# Patient Record
Sex: Female | Born: 1965 | Race: White | Hispanic: Yes | Marital: Married | Smoking: Never smoker
Health system: Southern US, Community
[De-identification: ages and names within clinical notes are randomized; demographics above are authoritative.]

## PROBLEM LIST (undated history)

## (undated) DIAGNOSIS — E785 Hyperlipidemia, unspecified: Secondary | ICD-10-CM

## (undated) DIAGNOSIS — E039 Hypothyroidism, unspecified: Secondary | ICD-10-CM

## (undated) DIAGNOSIS — J302 Other seasonal allergic rhinitis: Secondary | ICD-10-CM

## (undated) HISTORY — DX: Hypothyroidism, unspecified: E03.9

## (undated) HISTORY — DX: Other seasonal allergic rhinitis: J30.2

## (undated) HISTORY — PX: TUMOR EXCISION: SHX421

## (undated) HISTORY — DX: Hyperlipidemia, unspecified: E78.5

---

## 2001-11-20 ENCOUNTER — Other Ambulatory Visit: Admission: RE | Admit: 2001-11-20 | Discharge: 2001-11-20 | Payer: Self-pay | Admitting: Gynecology

## 2001-12-16 ENCOUNTER — Encounter: Payer: Self-pay | Admitting: Gastroenterology

## 2001-12-16 ENCOUNTER — Ambulatory Visit (HOSPITAL_COMMUNITY): Admission: RE | Admit: 2001-12-16 | Discharge: 2001-12-16 | Payer: Self-pay | Admitting: Gastroenterology

## 2009-08-31 ENCOUNTER — Ambulatory Visit (HOSPITAL_COMMUNITY): Admission: RE | Admit: 2009-08-31 | Discharge: 2009-08-31 | Payer: Self-pay | Admitting: Family Medicine

## 2010-09-04 ENCOUNTER — Other Ambulatory Visit: Payer: Self-pay

## 2010-09-04 DIAGNOSIS — Z139 Encounter for screening, unspecified: Secondary | ICD-10-CM

## 2010-09-13 ENCOUNTER — Ambulatory Visit (HOSPITAL_COMMUNITY)
Admission: RE | Admit: 2010-09-13 | Discharge: 2010-09-13 | Disposition: A | Discharge status: Home / Self Care | Payer: Self-pay | Source: Ambulatory Visit | Attending: Family Medicine | Admitting: Family Medicine

## 2010-09-13 DIAGNOSIS — Z139 Encounter for screening, unspecified: Secondary | ICD-10-CM

## 2010-09-13 DIAGNOSIS — Z1231 Encounter for screening mammogram for malignant neoplasm of breast: Secondary | ICD-10-CM | POA: Insufficient documentation

## 2011-09-19 ENCOUNTER — Other Ambulatory Visit (HOSPITAL_COMMUNITY): Payer: Self-pay | Admitting: Family Medicine

## 2011-09-19 DIAGNOSIS — Z139 Encounter for screening, unspecified: Secondary | ICD-10-CM

## 2011-09-23 ENCOUNTER — Ambulatory Visit (HOSPITAL_COMMUNITY)
Admission: RE | Admit: 2011-09-23 | Discharge: 2011-09-23 | Disposition: A | Discharge status: Home / Self Care | Payer: Self-pay | Source: Ambulatory Visit | Attending: Family Medicine | Admitting: Family Medicine

## 2011-09-23 DIAGNOSIS — Z139 Encounter for screening, unspecified: Secondary | ICD-10-CM

## 2012-10-06 ENCOUNTER — Other Ambulatory Visit (HOSPITAL_COMMUNITY): Payer: Self-pay | Admitting: *Deleted

## 2012-10-08 ENCOUNTER — Ambulatory Visit (HOSPITAL_COMMUNITY)
Admission: RE | Admit: 2012-10-08 | Discharge: 2012-10-08 | Disposition: A | Discharge status: Home / Self Care | Payer: Self-pay | Source: Ambulatory Visit | Attending: *Deleted | Admitting: *Deleted

## 2013-08-05 ENCOUNTER — Encounter: Payer: Self-pay | Admitting: Family Medicine

## 2013-08-05 ENCOUNTER — Ambulatory Visit (INDEPENDENT_AMBULATORY_CARE_PROVIDER_SITE_OTHER): Payer: BC Managed Care – HMO | Admitting: Family Medicine

## 2013-08-05 VITALS — BP 98/58 | HR 64 | Temp 97.9°F | Wt 201.0 lb

## 2013-08-05 DIAGNOSIS — H109 Unspecified conjunctivitis: Secondary | ICD-10-CM

## 2013-08-05 DIAGNOSIS — G510 Bell's palsy: Secondary | ICD-10-CM

## 2013-08-05 DIAGNOSIS — B009 Herpesviral infection, unspecified: Secondary | ICD-10-CM

## 2013-08-05 DIAGNOSIS — B001 Herpesviral vesicular dermatitis: Secondary | ICD-10-CM

## 2013-08-05 MED ORDER — POLYMYXIN B-TRIMETHOPRIM 10000-0.1 UNIT/ML-% OP SOLN: 2.0000 [drp] | OPHTHALMIC | Status: DC

## 2013-08-05 MED ORDER — FAMCICLOVIR 500 MG PO TABS: 500.0000 mg | ORAL_TABLET | Freq: Two times a day (BID) | ORAL | Status: DC

## 2013-08-05 MED ORDER — MAGIC MOUTHWASH: 5.0000 mL | Freq: Four times a day (QID) | ORAL | Status: DC | PRN

## 2013-08-05 MED ORDER — PREDNISONE 20 MG PO TABS: ORAL_TABLET | ORAL | Status: DC

## 2013-08-05 NOTE — Progress Notes (Signed)
   Subjective:    Patient ID: Kimberly Hicks, female    DOB: 07-08-1966, 48 y.o.   MRN: [id number]  HPI  This 48 y.o. female presents for evaluation of left facial numbness and drooping for a week.  She  Woke up in the am a week ago with left facial weakness, sore or cold sore on her toungue, and Some irritation in her eye.  She has brought her son in with her to help interpret.  Review of Systems No chest pain, SOB, HA, dizziness, vision change, N/V, diarrhea, constipation, dysuria, urinary urgency or frequency, myalgias, arthralgias or rash.     Objective:   Physical Exam  Vital signs noted  Well developed well nourished female.  HEENT - Head atraumatic Normocephalic                Eyes - PERRLA, Conjuctiva - clear Sclera- Clear EOMI                Ears - EAC's Wnl TM's Wnl Gross Hearing WNL                Nose - Nares patent                 Throat - oropharanx wnl Respiratory - Lungs CTA bilateral Cardiac - RRR S1 and S2 without murmur GI - Abdomen soft Nontender and bowel sounds active x 4 Extremities - No edema. Neuro - Left Face is flat w/o any forehead contour or wrinkles.  Unable to scrunch up left  Forehead or eyebrows.        Assessment & Plan:  Bell's palsy - Plan: famciclovir (FAMVIR) 500 MG tablet, predniSONE (DELTASONE) 20 MG tablet, Alum & Mag Hydroxide-Simeth (MAGIC MOUTHWASH) SOLN  Herpes labialis - Plan: famciclovir (FAMVIR) 500 MG tablet, predniSONE (DELTASONE) 20 MG tablet, Alum & Mag Hydroxide-Simeth (MAGIC MOUTHWASH) SOLN  Conjunctivitis - Plan: trimethoprim-polymyxin b (POLYTRIM) ophthalmic solution.  Follow up in one week  Deatra CanterWilliam J Naziyah Tieszen FNP

## 2013-08-05 NOTE — Patient Instructions (Signed)
Bell's Palsy  Bell's palsy is a condition in which the muscles on one side of the face cannot move (paralysis). This is because the nerves in the face are paralyzed. It is most often thought to be caused by a virus. The virus causes swelling of the nerve that controls movement on one side of the face. The nerve travels through a tight space surrounded by bone. When the nerve swells, it can be compressed by the bone. This results in damage to the protective covering around the nerve. This damage interferes with how the nerve communicates with the muscles of the face. As a result, it can cause weakness or paralysis of the facial muscles.   Injury (trauma), tumor, and surgery may cause Bell's palsy, but most of the time the cause is unknown. It is a relatively common condition. It starts suddenly (abrupt onset) with the paralysis usually ending within 2 days. Bell's palsy is not dangerous. But because the eye does not close properly, you may need care to keep the eye from getting dry. This can include splinting (to keep the eye shut) or moistening with artificial tears. Bell's palsy very seldom occurs on both sides of the face at the same time.  SYMPTOMS    Eyebrow sagging.   Drooping of the eyelid and corner of the mouth.   Inability to close one eye.   Loss of taste on the front of the tongue.   Sensitivity to loud noises.  TREATMENT   The treatment is usually non-surgical. If the patient is seen within the first 24 to 48 hours, a short course of steroids may be prescribed, in an attempt to shorten the length of the condition. Antiviral medicines may also be used with the steroids, but it is unclear if they are helpful.   You will need to protect your eye, if you cannot close it. The cornea (clear covering over your eye) will become dry and can be damaged. Artificial tears can be used to keep your eye moist. Glasses or an eye patch should be worn to protect your eye.  PROGNOSIS   Recovery is variable, ranging  from days to months. Although the problem usually goes away completely (about 80% of cases resolve), predicting the outcome is impossible. Most people improve within 3 weeks of when the symptoms began. Improvement may continue for 3 to 6 months. A small number of people have moderate to severe weakness that is permanent.   HOME CARE INSTRUCTIONS    If your caregiver prescribed medication to reduce swelling in the nerve, use as directed. Do not stop taking the medication unless directed by your caregiver.   Use moisturizing eye drops as needed to prevent drying of your eye, as directed by your caregiver.   Protect your eye, as directed by your caregiver.   Use facial massage and exercises, as directed by your caregiver.   Perform your normal activities, and get your normal rest.  SEEK IMMEDIATE MEDICAL CARE IF:    There is pain, redness or irritation in the eye.   You or your child has an oral temperature above 102 F (38.9 C), not controlled by medicine.  MAKE SURE YOU:    Understand these instructions.   Will watch your condition.   Will get help right away if you are not doing well or get worse.  Document Released: 07/15/2005 Document Revised: 10/07/2011 Document Reviewed: 07/24/2009  [employer] Patient Information 2014 [employer].

## 2013-08-12 ENCOUNTER — Encounter: Payer: Self-pay | Admitting: Family Medicine

## 2013-08-12 ENCOUNTER — Ambulatory Visit (INDEPENDENT_AMBULATORY_CARE_PROVIDER_SITE_OTHER): Payer: BC Managed Care – HMO | Admitting: Family Medicine

## 2013-08-12 VITALS — BP 98/59 | HR 68 | Temp 98.2°F | Wt 198.0 lb

## 2013-08-12 DIAGNOSIS — G510 Bell's palsy: Secondary | ICD-10-CM

## 2013-08-12 DIAGNOSIS — H539 Unspecified visual disturbance: Secondary | ICD-10-CM

## 2013-08-12 DIAGNOSIS — R51 Headache: Secondary | ICD-10-CM

## 2013-08-12 DIAGNOSIS — R519 Headache, unspecified: Secondary | ICD-10-CM

## 2013-08-12 MED ORDER — OXCARBAZEPINE 300 MG PO TABS: 300.0000 mg | ORAL_TABLET | Freq: Two times a day (BID) | ORAL | Status: DC

## 2013-08-12 MED ORDER — HYDROCODONE-ACETAMINOPHEN 5-325 MG PO TABS: 1.0000 | ORAL_TABLET | Freq: Four times a day (QID) | ORAL | Status: DC | PRN

## 2013-08-12 NOTE — Progress Notes (Signed)
   Subjective:    Patient ID: Kimberly Hicks, female    DOB: 1965/09/26, 48 y.o.   MRN: [id number]  HPI This 48 y.o. female presents for evaluation of follow up.  She is doing a lot better and she Has left facial discomfort and she c/o some change in her visual acuity.  She is able to move her Left face a lot better and her eye is closing.   Review of Systems No chest pain, SOB, HA, dizziness, vision change, N/V, diarrhea, constipation, dysuria, urinary urgency or frequency, myalgias, arthralgias or rash.     Objective:   Physical Exam Vital signs noted  Well developed well nourished female.  HEENT - Head atraumatic Normocephalic                Eyes - PERRLA, Conjuctiva - clear Sclera- Clear EOMI                Ears - EAC's Wnl TM's Wnl Gross Hearing WNL                Nose - Nares patent                 Throat - oropharanx wnl Respiratory - Lungs CTA bilateral Cardiac - RRR S1 and S2 without murmur GI - Abdomen soft Nontender and bowel sounds active x 4 Extremities - No edema. Neuro - Discomfort along her trigeminal nerve area on the left side of her face       Assessment & Plan:  Bell's palsy - Plan: Oxcarbazepine (TRILEPTAL) 300 MG tablet, DISCONTINUED: Oxcarbazepine (TRILEPTAL) 300 MG tablet, CANCELED: Ambulatory referral to Neurology  Facial pain - Plan: HYDROcodone-acetaminophen (NORCO) 5-325 MG per tablet, Oxcarbazepine (TRILEPTAL) 300 MG tablet, CANCELED: Ambulatory referral to Neurology  Visual disturbance - Plan: Oxcarbazepine (TRILEPTAL) 300 MG tablet, CANCELED: Ambulatory referral to Ophthalmology  She is worried she cannot afford the neurologist so will rx trileptal and follow up in 2 weeks and cx opthamology and neurology referral per patient request  Deatra CanterWilliam J Oxford FNP

## 2013-08-12 NOTE — Patient Instructions (Addendum)
Check with insurance company to see if Kimberly Hicks has a co-pay or deductible for office visits.   Bell's Palsy Bell's palsy is a condition in which the muscles on one side of the face cannot move (paralysis). This is because the nerves in the face are paralyzed. It is most often thought to be caused by a virus. The virus causes swelling of the nerve that controls movement on one side of the face. The nerve travels through a tight space surrounded by bone. When the nerve swells, it can be compressed by the bone. This results in damage to the protective covering around the nerve. This damage interferes with how the nerve communicates with the muscles of the face. As a result, it can cause weakness or paralysis of the facial muscles.  Injury (trauma), tumor, and surgery may cause Bell's palsy, but most of the time the cause is unknown. It is a relatively common condition. It starts suddenly (abrupt onset) with the paralysis usually ending within 2 days. Bell's palsy is not dangerous. But because the eye does not close properly, you may need care to keep the eye from getting dry. This can include splinting (to keep the eye shut) or moistening with artificial tears. Bell's palsy very seldom occurs on both sides of the face at the same time. SYMPTOMS   Eyebrow sagging.  Drooping of the eyelid and corner of the mouth.  Inability to close one eye.  Loss of taste on the front of the tongue.  Sensitivity to loud noises. TREATMENT  The treatment is usually non-surgical. If the patient is seen within the first 24 to 48 hours, a short course of steroids may be prescribed, in an attempt to shorten the length of the condition. Antiviral medicines may also be used with the steroids, but it is unclear if they are helpful.  You will need to protect your eye, if you cannot close it. The cornea (clear covering over your eye) will become dry and can be damaged. Artificial tears can be used to keep your eye moist. Glasses  or an eye patch should be worn to protect your eye. PROGNOSIS  Recovery is variable, ranging from days to months. Although the problem usually goes away completely (about 80% of cases resolve), predicting the outcome is impossible. Most people improve within 3 weeks of when the symptoms began. Improvement may continue for 3 to 6 months. A small number of people have moderate to severe weakness that is permanent.  HOME CARE INSTRUCTIONS   If your caregiver prescribed medication to reduce swelling in the nerve, use as directed. Do not stop taking the medication unless directed by your caregiver.  Use moisturizing eye drops as needed to prevent drying of your eye, as directed by your caregiver.  Protect your eye, as directed by your caregiver.  Use facial massage and exercises, as directed by your caregiver.  Perform your normal activities, and get your normal rest. SEEK IMMEDIATE MEDICAL CARE IF:   There is pain, redness or irritation in the eye.  You or your child has an oral temperature above 102 F (38.9 C), not controlled by medicine. MAKE SURE YOU:   Understand these instructions.  Will watch your condition.  Will get help right away if you are not doing well or get worse. Document Released: 07/15/2005 Document Revised: 10/07/2011 Document Reviewed: 07/24/2009 [facility] Patient Information 2014 [place], [place].

## 2013-08-26 ENCOUNTER — Ambulatory Visit: Payer: BC Managed Care – HMO | Admitting: Family Medicine

## 2013-12-23 ENCOUNTER — Other Ambulatory Visit (HOSPITAL_COMMUNITY): Payer: Self-pay | Admitting: *Deleted

## 2013-12-23 DIAGNOSIS — Z139 Encounter for screening, unspecified: Secondary | ICD-10-CM

## 2013-12-28 ENCOUNTER — Ambulatory Visit (HOSPITAL_COMMUNITY)
Admission: RE | Admit: 2013-12-28 | Discharge: 2013-12-28 | Disposition: A | Discharge status: Home / Self Care | Payer: BC Managed Care – PPO | Source: Ambulatory Visit | Attending: *Deleted | Admitting: *Deleted

## 2013-12-28 DIAGNOSIS — Z1231 Encounter for screening mammogram for malignant neoplasm of breast: Secondary | ICD-10-CM | POA: Insufficient documentation

## 2013-12-28 DIAGNOSIS — Z139 Encounter for screening, unspecified: Secondary | ICD-10-CM

## 2014-09-02 ENCOUNTER — Ambulatory Visit (INDEPENDENT_AMBULATORY_CARE_PROVIDER_SITE_OTHER): Payer: BLUE CROSS/BLUE SHIELD

## 2014-09-02 ENCOUNTER — Ambulatory Visit (INDEPENDENT_AMBULATORY_CARE_PROVIDER_SITE_OTHER): Payer: BLUE CROSS/BLUE SHIELD | Admitting: Family Medicine

## 2014-09-02 VITALS — BP 87/53 | HR 102 | Temp 101.9°F | Ht <= 58 in | Wt 200.0 lb

## 2014-09-02 DIAGNOSIS — R509 Fever, unspecified: Secondary | ICD-10-CM

## 2014-09-02 DIAGNOSIS — K5909 Other constipation: Secondary | ICD-10-CM

## 2014-09-02 DIAGNOSIS — R110 Nausea: Secondary | ICD-10-CM

## 2014-09-02 DIAGNOSIS — N390 Urinary tract infection, site not specified: Secondary | ICD-10-CM

## 2014-09-02 DIAGNOSIS — R1032 Left lower quadrant pain: Secondary | ICD-10-CM

## 2014-09-02 LAB — POCT URINALYSIS DIPSTICK
Bilirubin, UA: NEGATIVE
Glucose, UA: NEGATIVE
Nitrite, UA: NEGATIVE
Spec Grav, UA: 1.02
Urobilinogen, UA: NEGATIVE
pH, UA: 6

## 2014-09-02 LAB — POCT CBC
Granulocyte percent: 89.3 %G — AB (ref 37–80)
HCT, POC: 35.7 % — AB (ref 37.7–47.9)
Hemoglobin: 11 g/dL — AB (ref 12.2–16.2)
Lymph, poc: 1.1 (ref 0.6–3.4)
MCH, POC: 26.8 pg — AB (ref 27–31.2)
MCHC: 30.7 g/dL — AB (ref 31.8–35.4)
MCV: 87.1 fL (ref 80–97)
MPV: 5.9 fL (ref 0–99.8)
POC Granulocyte: 12.9 — AB (ref 2–6.9)
POC LYMPH PERCENT: 7.8 %L — AB (ref 10–50)
Platelet Count, POC: 231 10*3/uL (ref 142–424)
RBC: 4.1 M/uL (ref 4.04–5.48)
RDW, POC: 15.5 %
WBC: 14.4 10*3/uL — AB (ref 4.6–10.2)

## 2014-09-02 LAB — POCT UA - MICROSCOPIC ONLY
Casts, Ur, LPF, POC: NEGATIVE
Crystals, Ur, HPF, POC: NEGATIVE
Yeast, UA: NEGATIVE

## 2014-09-02 LAB — POCT INFLUENZA A/B
Influenza A, POC: NEGATIVE
Influenza B, POC: NEGATIVE

## 2014-09-02 MED ORDER — CEFTRIAXONE SODIUM 1 G IJ SOLR
1.0000 g | Freq: Once | INTRAMUSCULAR | Status: AC
  Administered 2014-09-02: 1 g via INTRAMUSCULAR

## 2014-09-02 MED ORDER — CIPROFLOXACIN HCL 500 MG PO TABS: 500.0000 mg | ORAL_TABLET | Freq: Two times a day (BID) | ORAL | Status: DC

## 2014-09-02 MED ORDER — ONDANSETRON 8 MG PO TBDP: 8.0000 mg | ORAL_TABLET | Freq: Three times a day (TID) | ORAL | Status: DC | PRN

## 2014-09-02 NOTE — Progress Notes (Signed)
Subjective:    Patient ID: Kimberly Hicks, female    DOB: [birth year], 49 y.o.   MRN: [id number]  HPI Patient c/o NVD and fever starting yesterday.  She states she has dysuria and LLQ abdominal pain.  She has been feeling bad since yesterday.  Review of Systems  Constitutional: Negative for fever.  HENT: Negative for ear pain.   Eyes: Negative for discharge.  Respiratory: Negative for cough.   Cardiovascular: Negative for chest pain.  Gastrointestinal: Negative for abdominal distention.  Endocrine: Negative for polyuria.  Genitourinary: Negative for difficulty urinating.  Musculoskeletal: Negative for gait problem and neck pain.  Skin: Negative for color change and rash.  Neurological: Negative for speech difficulty and headaches.  Psychiatric/Behavioral: Negative for agitation.   Results for orders placed or performed in visit on 09/02/14  POCT Influenza A/B  Result Value Ref Range   Influenza A, POC Negative    Influenza B, POC Negative   POCT CBC  Result Value Ref Range   WBC 14.4 (A) 4.6 - 10.2 K/uL   Lymph, poc 1.1 0.6 - 3.4   POC LYMPH PERCENT 7.8 (A) 10 - 50 %L   POC Granulocyte 12.9 (A) 2 - 6.9   Granulocyte percent 89.3 (A) 37 - 80 %G   RBC 4.1 4.04 - 5.48 M/uL   Hemoglobin 11.0 (A) 12.2 - 16.2 g/dL   HCT, POC 40.935.7 (A) 81.137.7 - 47.9 %   MCV 87.1 80 - 97 fL   MCH, POC 26.8 (A) 27 - 31.2 pg   MCHC 30.7 (A) 31.8 - 35.4 g/dL   RDW, POC 91.415.5 %   Platelet Count, POC 231.0 142 - 424 K/uL   MPV 5.9 0 - 99.8 fL  POCT UA - Microscopic Only  Result Value Ref Range   WBC, Ur, HPF, POC 40-80    RBC, urine, microscopic 1-3    Bacteria, U Microscopic moderate    Mucus, UA moderate    Epithelial cells, urine per micros moderate    Crystals, Ur, HPF, POC negative    Casts, Ur, LPF, POC negative    Yeast, UA negaive   POCT urinalysis dipstick  Result Value Ref Range   Color, UA orange    Clarity, UA clear    Glucose, UA negative    Bilirubin, UA negative    Ketones,  UA large    Spec Grav, UA 1.020    Blood, UA large    pH, UA 6.0    Protein, UA 4+    Urobilinogen, UA negative    Nitrite, UA negative    Leukocytes, UA large (3+)       Objective:    BP 87/53 mmHg  Pulse 102  Temp(Src) 101.9 F (38.8 C) (Oral)  Ht 3\' 5"  (1.041 m)  Wt 200 lb (90.719 kg)  BMI 83.71 kg/m2 Physical Exam  Constitutional: She is oriented to person, place, and time. She appears well-developed and well-nourished.  HENT:  Head: Normocephalic and atraumatic.  Mouth/Throat: Oropharynx is clear and moist.  Eyes: Pupils are equal, round, and reactive to light.  Neck: Normal range of motion. Neck supple.  Cardiovascular: Normal rate and regular rhythm.   No murmur heard. Pulmonary/Chest: Effort normal and breath sounds normal.  Abdominal: Bowel sounds are normal. There is tenderness. There is guarding.  Tenderness LLQ and LUQ of abdomen  Neurological: She is alert and oriented to person, place, and time.  Skin: Skin is warm and dry.  Psychiatric: She has a  normal mood and affect.    Results for orders placed or performed in visit on 09/02/14  POCT Influenza A/B  Result Value Ref Range   Influenza A, POC Negative    Influenza B, POC Negative   POCT CBC  Result Value Ref Range   WBC 14.4 (A) 4.6 - 10.2 K/uL   Lymph, poc 1.1 0.6 - 3.4   POC LYMPH PERCENT 7.8 (A) 10 - 50 %L   POC Granulocyte 12.9 (A) 2 - 6.9   Granulocyte percent 89.3 (A) 37 - 80 %G   RBC 4.1 4.04 - 5.48 M/uL   Hemoglobin 11.0 (A) 12.2 - 16.2 g/dL   HCT, POC 16.1 (A) 09.6 - 47.9 %   MCV 87.1 80 - 97 fL   MCH, POC 26.8 (A) 27 - 31.2 pg   MCHC 30.7 (A) 31.8 - 35.4 g/dL   RDW, POC 04.5 %   Platelet Count, POC 231.0 142 - 424 K/uL   MPV 5.9 0 - 99.8 fL  POCT UA - Microscopic Only  Result Value Ref Range   WBC, Ur, HPF, POC 40-80    RBC, urine, microscopic 1-3    Bacteria, U Microscopic moderate    Mucus, UA moderate    Epithelial cells, urine per micros moderate    Crystals, Ur, HPF, POC  negative    Casts, Ur, LPF, POC negative    Yeast, UA negaive   POCT urinalysis dipstick  Result Value Ref Range   Color, UA orange    Clarity, UA clear    Glucose, UA negative    Bilirubin, UA negative    Ketones, UA large    Spec Grav, UA 1.020    Blood, UA large    pH, UA 6.0    Protein, UA 4+    Urobilinogen, UA negative    Nitrite, UA negative    Leukocytes, UA large (3+)    KUB - Constipation Prelimnary reading by Angeline Slim     Assessment & Plan:     ICD-9-CM ICD-10-CM   1. Chills with fever 780.60 R50.9 POCT Influenza A/B     POCT CBC     POCT UA - Microscopic Only     POCT urinalysis dipstick     DG Abd 1 View     ciprofloxacin (CIPRO) 500 MG tablet  2. Left lower quadrant pain 789.04 R10.32 POCT CBC     POCT UA - Microscopic Only     POCT urinalysis dipstick     DG Abd 1 View     ondansetron (ZOFRAN ODT) 8 MG disintegrating tablet  3. Other constipation 564.09 K59.09 ciprofloxacin (CIPRO) 500 MG tablet  4. Urinary tract infection without hematuria, site unspecified 599.0 N39.0 cefTRIAXone (ROCEPHIN) injection 1 g     Urine culture     ciprofloxacin (CIPRO) 500 MG tablet  5. Nausea without vomiting 787.02 R11.0 ondansetron (ZOFRAN ODT) 8 MG disintegrating tablet  6. Fever - Tylenol  po given 7. Nausea - Zofran  po in clinic given  Return if symptoms worsen or fail to improve.  Deatra Canter FNP

## 2014-09-02 NOTE — Progress Notes (Signed)
#   20g started in Right AC per order of Ander SladeBill Oxford NP with 1,000cc of NS that infused in over 1 hour. IV removed per order Pt tolerated well.

## 2014-09-04 LAB — URINE CULTURE

## 2014-09-05 ENCOUNTER — Other Ambulatory Visit: Payer: Self-pay | Admitting: Family Medicine

## 2014-09-05 MED ORDER — AMOXICILLIN-POT CLAVULANATE 875-125 MG PO TABS: 1.0000 | ORAL_TABLET | Freq: Two times a day (BID) | ORAL | Status: DC

## 2014-09-06 ENCOUNTER — Ambulatory Visit (INDEPENDENT_AMBULATORY_CARE_PROVIDER_SITE_OTHER): Payer: BLUE CROSS/BLUE SHIELD | Admitting: Family Medicine

## 2014-09-06 VITALS — BP 95/56 | HR 67 | Temp 97.6°F | Wt 200.0 lb

## 2014-09-06 DIAGNOSIS — J0100 Acute maxillary sinusitis, unspecified: Secondary | ICD-10-CM

## 2014-09-06 MED ORDER — BUTALBITAL-APAP-CAFFEINE 50-325-40 MG PO TABS: 1.0000 | ORAL_TABLET | Freq: Four times a day (QID) | ORAL | Status: AC | PRN

## 2014-09-06 MED ORDER — METHYLPREDNISOLONE (PAK) 4 MG PO TABS: ORAL_TABLET | ORAL | Status: DC

## 2014-09-06 MED ORDER — HYDROXYZINE HCL 25 MG PO TABS
25.0000 mg | ORAL_TABLET | Freq: Three times a day (TID) | ORAL | Status: DC | PRN
Start: 2014-09-06 — End: 2023-11-18

## 2014-09-06 NOTE — Progress Notes (Signed)
   Subjective:    Patient ID: Kimberly Hicks, female    DOB: [birth year], 49 y.o.   MRN: [id number]  HPI Patient is having headache at night.  She is having headache at night and she has facial discomfort. She is having some cough and uri sx's. She is having a rash on her bilateral wists.  Review of Systems  Constitutional: Negative for fever.  HENT: Negative for ear pain.   Eyes: Negative for discharge.  Respiratory: Negative for cough.   Cardiovascular: Negative for chest pain.  Gastrointestinal: Negative for abdominal distention.  Endocrine: Negative for polyuria.  Genitourinary: Negative for difficulty urinating.  Musculoskeletal: Negative for gait problem and neck pain.  Skin: Negative for color change and rash.  Neurological: Negative for speech difficulty and headaches.  Psychiatric/Behavioral: Negative for agitation.       Objective:    BP 95/56 mmHg  Pulse 67  Temp(Src) 97.6 F (36.4 C) (Oral)  Wt 200 lb (90.719 kg) Physical Exam  Constitutional: She is oriented to person, place, and time. She appears well-developed and well-nourished.  HENT:  Head: Normocephalic and atraumatic.  Mouth/Throat: Oropharynx is clear and moist.  Eyes: Pupils are equal, round, and reactive to light.  Neck: Normal range of motion. Neck supple.  Cardiovascular: Normal rate and regular rhythm.   No murmur heard. Pulmonary/Chest: Effort normal and breath sounds normal.  Abdominal: Soft. Bowel sounds are normal. There is no tenderness.  Neurological: She is alert and oriented to person, place, and time.  Skin: Skin is warm and dry.  Psychiatric: She has a normal mood and affect.          Assessment & Plan:     ICD-9-CM ICD-10-CM   1. Subacute maxillary sinusitis 461.0 J01.00 butalbital-acetaminophen-caffeine (FIORICET) 50-325-40 MG per tablet     methylPREDNIsolone (MEDROL DOSPACK) 4 MG tablet     hydrOXYzine (ATARAX/VISTARIL) 25 MG tablet     No Follow-up on file.  Deatra CanterWilliam  J Oxford FNP

## 2015-01-11 ENCOUNTER — Other Ambulatory Visit (HOSPITAL_COMMUNITY): Payer: Self-pay | Admitting: *Deleted

## 2015-01-11 DIAGNOSIS — Z1231 Encounter for screening mammogram for malignant neoplasm of breast: Secondary | ICD-10-CM

## 2015-01-19 ENCOUNTER — Ambulatory Visit (HOSPITAL_COMMUNITY)
Admission: RE | Admit: 2015-01-19 | Discharge: 2015-01-19 | Disposition: A | Discharge status: Home / Self Care | Payer: BLUE CROSS/BLUE SHIELD | Source: Ambulatory Visit | Attending: *Deleted | Admitting: *Deleted

## 2015-01-19 DIAGNOSIS — Z1231 Encounter for screening mammogram for malignant neoplasm of breast: Secondary | ICD-10-CM

## 2016-01-16 ENCOUNTER — Other Ambulatory Visit (HOSPITAL_COMMUNITY): Payer: Self-pay | Admitting: *Deleted

## 2016-01-16 DIAGNOSIS — Z1231 Encounter for screening mammogram for malignant neoplasm of breast: Secondary | ICD-10-CM

## 2016-01-22 ENCOUNTER — Ambulatory Visit (HOSPITAL_COMMUNITY)
Admission: RE | Admit: 2016-01-22 | Discharge: 2016-01-22 | Disposition: A | Discharge status: Home / Self Care | Payer: Self-pay | Source: Ambulatory Visit | Attending: *Deleted | Admitting: *Deleted

## 2016-01-22 DIAGNOSIS — Z1231 Encounter for screening mammogram for malignant neoplasm of breast: Secondary | ICD-10-CM

## 2017-06-03 ENCOUNTER — Other Ambulatory Visit: Payer: Self-pay | Admitting: Family Medicine

## 2017-06-03 ENCOUNTER — Ambulatory Visit
Admission: RE | Admit: 2017-06-03 | Discharge: 2017-06-03 | Disposition: A | Discharge status: Home / Self Care | Payer: No Typology Code available for payment source | Source: Ambulatory Visit | Attending: Family Medicine | Admitting: Family Medicine

## 2017-06-03 DIAGNOSIS — M79672 Pain in left foot: Secondary | ICD-10-CM

## 2018-02-10 ENCOUNTER — Other Ambulatory Visit (HOSPITAL_COMMUNITY): Payer: Self-pay | Admitting: *Deleted

## 2018-02-10 DIAGNOSIS — Z1231 Encounter for screening mammogram for malignant neoplasm of breast: Secondary | ICD-10-CM

## 2018-02-19 ENCOUNTER — Ambulatory Visit (HOSPITAL_COMMUNITY)
Admission: RE | Admit: 2018-02-19 | Discharge: 2018-02-19 | Disposition: A | Discharge status: Home / Self Care | Payer: PRIVATE HEALTH INSURANCE | Source: Ambulatory Visit | Attending: *Deleted | Admitting: *Deleted

## 2018-02-19 DIAGNOSIS — Z1231 Encounter for screening mammogram for malignant neoplasm of breast: Secondary | ICD-10-CM | POA: Diagnosis not present

## 2019-03-16 ENCOUNTER — Other Ambulatory Visit (HOSPITAL_COMMUNITY): Payer: Self-pay | Admitting: *Deleted

## 2019-03-16 DIAGNOSIS — Z1231 Encounter for screening mammogram for malignant neoplasm of breast: Secondary | ICD-10-CM

## 2019-03-25 ENCOUNTER — Ambulatory Visit (HOSPITAL_COMMUNITY)
Admission: RE | Admit: 2019-03-25 | Discharge: 2019-03-25 | Disposition: A | Discharge status: Home / Self Care | Payer: PRIVATE HEALTH INSURANCE | Source: Ambulatory Visit | Attending: *Deleted | Admitting: *Deleted

## 2019-03-25 ENCOUNTER — Other Ambulatory Visit: Payer: Self-pay

## 2019-03-25 DIAGNOSIS — Z1231 Encounter for screening mammogram for malignant neoplasm of breast: Secondary | ICD-10-CM | POA: Insufficient documentation

## 2020-07-20 ENCOUNTER — Emergency Department (HOSPITAL_COMMUNITY): Payer: HRSA Program

## 2020-07-20 ENCOUNTER — Encounter (HOSPITAL_COMMUNITY): Payer: Self-pay | Admitting: Emergency Medicine

## 2020-07-20 ENCOUNTER — Emergency Department (HOSPITAL_COMMUNITY)
Admission: EM | Admit: 2020-07-20 | Discharge: 2020-07-20 | Disposition: A | Discharge status: Home / Self Care | Payer: HRSA Program | Attending: Emergency Medicine | Admitting: Emergency Medicine

## 2020-07-20 ENCOUNTER — Other Ambulatory Visit: Payer: Self-pay

## 2020-07-20 DIAGNOSIS — E039 Hypothyroidism, unspecified: Secondary | ICD-10-CM | POA: Insufficient documentation

## 2020-07-20 DIAGNOSIS — Z20822 Contact with and (suspected) exposure to covid-19: Secondary | ICD-10-CM

## 2020-07-20 DIAGNOSIS — U071 COVID-19: Secondary | ICD-10-CM | POA: Diagnosis not present

## 2020-07-20 DIAGNOSIS — Z79899 Other long term (current) drug therapy: Secondary | ICD-10-CM | POA: Insufficient documentation

## 2020-07-20 DIAGNOSIS — J069 Acute upper respiratory infection, unspecified: Secondary | ICD-10-CM | POA: Diagnosis not present

## 2020-07-20 DIAGNOSIS — R059 Cough, unspecified: Secondary | ICD-10-CM | POA: Diagnosis present

## 2020-07-20 LAB — RESP PANEL BY RT-PCR (FLU A&B, COVID) ARPGX2
Influenza A by PCR: NEGATIVE
Influenza B by PCR: NEGATIVE
SARS Coronavirus 2 by RT PCR: POSITIVE — AB

## 2020-07-20 MED ORDER — ACETAMINOPHEN 325 MG PO TABS
650.0000 mg | ORAL_TABLET | Freq: Once | ORAL | Status: AC | PRN
  Administered 2020-07-20: 650 mg via ORAL
  Filled 2020-07-20: qty 2

## 2020-07-20 MED ORDER — ONDANSETRON 4 MG PO TBDP: 4.0000 mg | ORAL_TABLET | Freq: Three times a day (TID) | ORAL | 0 refills | Status: DC | PRN

## 2020-07-20 MED ORDER — ONDANSETRON 4 MG PO TBDP
4.0000 mg | ORAL_TABLET | Freq: Once | ORAL | Status: AC
  Administered 2020-07-20: 20:00:00 4 mg via ORAL
  Filled 2020-07-20: qty 1

## 2020-07-20 NOTE — ED Provider Notes (Signed)
[facility] EMERGENCY DEPARTMENT Provider Note   CSN: [id number] Arrival date & time: 07/20/20  1813     History Chief Complaint  Patient presents with  . Cough    Kimberly Hicks is a 54 y.o. female.  HPI 54 year old female with a history of hypothyroidism presents to the ER with Covid-like symptoms.  History provided via a [place] interpreter.  Patient states that her son tested positive for Covid last week.  Has had nausea, vomiting, poor p.o. intake, diarrhea, cough, fevers.  She is not vaccinated.  Denies any chest pain, no shortness of breath.  Has had nonbloody vomiting.  Her other son is also exhibiting similar symptoms.    Past Medical History:  Diagnosis Date  . Hypothyroidism     There are no problems to display for this patient.   Past Surgical History:  Procedure Laterality Date  . CESAREAN SECTION    . TUMOR EXCISION       OB History   No obstetric history on file.     Family History  Problem Relation Age of Onset  . Pulmonary embolism Mother   . Alcohol abuse Sister     Social History   Tobacco Use  . Smoking status: Never Smoker  . Smokeless tobacco: Never Used  Vaping Use  . Vaping Use: Never used  Substance Use Topics  . Alcohol use: No  . Drug use: No    Home Medications Prior to Admission medications   Medication Sig Start Date End Date Taking? Authorizing Provider  Alum & Mag Hydroxide-Simeth (MAGIC MOUTHWASH) SOLN Take 5 mLs by mouth 4 (four) times daily as needed for mouth pain. 08/05/13   Deatra Canter, FNP  amoxicillin-clavulanate (AUGMENTIN) 875-125 MG per tablet Take 1 tablet by mouth 2 (two) times daily. 09/05/14   Deatra Canter, FNP  ciprofloxacin (CIPRO) 500 MG tablet Take 1 tablet (500 mg total) by mouth 2 (two) times daily. 09/02/14   Deatra Canter, FNP  famciclovir (FAMVIR) 500 MG tablet Take 1 tablet (500 mg total) by mouth 2 (two) times daily. 08/05/13   Deatra Canter, FNP  HYDROcodone-acetaminophen (NORCO)  5-325 MG per tablet Take 1 tablet by mouth every 6 (six) hours as needed for moderate pain. 08/12/13   Deatra Canter, FNP  hydrOXYzine (ATARAX/VISTARIL) 25 MG tablet Take 1 tablet (25 mg total) by mouth 3 (three) times daily as needed. 09/06/14   Deatra Canter, FNP  ibuprofen (ADVIL,MOTRIN) 800 MG tablet Take 800 mg by mouth 3 (three) times daily.    [provider]  levothyroxine (SYNTHROID, LEVOTHROID) 25 MCG tablet Take 25 mcg by mouth daily before breakfast.    [provider]  methylPREDNIsolone (MEDROL DOSPACK) 4 MG tablet follow package directions 09/06/14   Deatra Canter, FNP  ondansetron (ZOFRAN ODT) 4 MG disintegrating tablet Take 1 tablet (4 mg total) by mouth every 8 (eight) hours as needed for nausea or vomiting. 07/20/20   Mare Ferrari, PA-C  Oxcarbazepine (TRILEPTAL) 300 MG tablet Take 1 tablet (300 mg total) by mouth 2 (two) times daily. 08/12/13   Deatra Canter, FNP    Allergies    Polytrim [polymyxin b-trimethoprim] and Ciprofloxacin  Review of Systems   Review of Systems  Constitutional: Positive for activity change, appetite change, chills, fatigue and fever.  Respiratory: Positive for cough. Negative for shortness of breath.   Cardiovascular: Negative for chest pain.  Gastrointestinal: Positive for diarrhea, nausea and vomiting. Negative for abdominal pain.  Genitourinary: Negative for dysuria.  Neurological: Positive for weakness.    Physical Exam Updated Vital Signs BP 132/79 (BP Location: Right Arm)   Pulse 88   Temp (!) 100.7 F (38.2 C) (Oral)   Resp 17   Ht 5\' 1"  (1.549 m)   Wt 93.9 kg   SpO2 98%   BMI 39.11 kg/m   Physical Exam Vitals and nursing note reviewed.  Constitutional:      General: She is not in acute distress.    Appearance: She is well-developed and well-nourished. She is obese. She is not ill-appearing, toxic-appearing or diaphoretic.     Comments: Well-appearing, speaking full sentences in no acute  respiratory distress  HENT:     Head: Normocephalic and atraumatic.  Eyes:     Conjunctiva/sclera: Conjunctivae normal.  Cardiovascular:     Rate and Rhythm: Normal rate and regular rhythm.     Pulses: Normal pulses.     Heart sounds: No murmur heard.   Pulmonary:     Effort: Pulmonary effort is normal. No respiratory distress.     Breath sounds: Normal breath sounds.  Abdominal:     General: Abdomen is flat.     Palpations: Abdomen is soft.     Tenderness: There is no abdominal tenderness.  Musculoskeletal:        General: No edema.     Cervical back: Neck supple.  Skin:    General: Skin is warm and dry.  Neurological:     Mental Status: She is alert.  Psychiatric:        Mood and Affect: Mood and affect normal.     ED Results / Procedures / Treatments   Labs (all labs ordered are listed, but only abnormal results are displayed) Labs Reviewed  RESP PANEL BY RT-PCR (FLU A&B, COVID) [id number]    EKG None  Radiology DG Chest Portable 1 View  Result Date: 07/20/2020 CLINICAL DATA:  Cough, COVID exposure EXAM: PORTABLE CHEST 1 VIEW COMPARISON:  None. FINDINGS: Streaky basilar opacities and more patchy opacity in the left basilar periphery. No pneumothorax or effusion. No convincing features of edema. The cardiomediastinal contours are unremarkable. No acute osseous or soft tissue abnormality. IMPRESSION: Streaky basilar opacities with more patchy opacity in the left basilar periphery, suspicious for infection including atypical viral pneumonia in the setting of COVID 19 exposure. Electronically Signed   By: 07/22/2020 M.D.   On: 07/20/2020 19:54    Procedures Procedures (including critical care time)  Medications Ordered in ED Medications  acetaminophen (TYLENOL) tablet 650 mg (650 mg Oral Given 07/20/20 1842)  ondansetron (ZOFRAN-ODT) disintegrating tablet 4 mg (4 mg Oral Given 07/20/20 1948)    ED Course  I have reviewed the triage vital signs and the nursing  notes.  Pertinent labs & imaging results that were available during my care of the patient were reviewed by me and considered in my medical decision making (see chart for details).    MDM Rules/Calculators/A&P                      Patients symptoms are consistent with URI, likely viral etiology. Consider COVID-19, COVID test sent. Known COVID exposure. Patient is not vaccinated against COVID-19. Patient is febrile with a temp of 100.7, treated with Tyleno, tolerating secretions.  Lungs clear to auscultation bilaterally. Normal O2 saturation including at rest and ambulation. CXR suspicious for viral pneumonia in the setting of known COVID 19 exposure. Pt will be discharged with symptomatic  treatment, home isolation precautions. Zofran sent for nausea. Will contact [facility] infusion clinic if COVID test is positive. Return precautions discussed. Verbalizes understanding and is agreeable with plan. Pt is hemodynamically stable & in NAD prior to dc.  Final Clinical Impression(s) / ED Diagnoses Final diagnoses:  Encounter for laboratory testing for COVID-19 virus  Upper respiratory tract infection, unspecified type    Rx / DC Orders ED Discharge Orders         Ordered    ondansetron (ZOFRAN ODT) 4 MG disintegrating tablet  Every 8 hours PRN        07/20/20 1958           Leone Brand 07/20/20 Alycia Patten, MD 07/21/20 1517

## 2020-07-20 NOTE — ED Notes (Signed)
Pt ambulated around room multiple times and appeared to be in NAD. O2 remained 95-96% on room air.

## 2020-07-20 NOTE — ED Notes (Signed)
X-ray at bedside

## 2020-07-20 NOTE — Discharge Instructions (Addendum)
Lea las instrucciones a continuacin. Puede alternar Tylenol / acetaminofn y Advil / ibuprofeno / Motrin cada 4 horas para el dolor de garganta, dolores corporales, dolor de [place] o [place]. Puede usar Zofran segn sea necesario para las nuseas [address]. Use un aerosol nasal de solucin salina para la congestin. Puede tomar Robitussin cada 8 horas segn sea necesario para la tos. Lvese las manos con frecuencia. Tienes pendiente una prueba de COVID. Asle en casa mientras espera sus resultados. > Si su prueba es negativa, qudese en casa hasta que la fiebre haya desaparecido o sus sntomas mejoren. > Si su prueba es positiva, asle en casa durante al menos 14 das despus del da en que comenzaron sus sntomas [place], y [place] al menos 24 horas despus de que no tenga fiebre sin la ayuda de medicamentos Y sus sntomas estn mejorando. > Si su prueba es positiva, la clnica de infusin [facility] se Audiological scientist con usted para discutir la programacin de una infusin como posible tratamiento para COVID. Su radiografa de trax [place] que tiene una infeccin de neumona; sin embargo, es probable que se deba a un virus y no se trata con antibiticos. Esto debera aclararse por s solo. Haga un seguimiento con su proveedor de atencin primaria o con la clnica de atencin posterior a COVID en [place]. Puede comprar un oxmetro de pulso que [employer] niveles de oxgeno en su sangre; regrese a la sala de emergencias si sus niveles estn constantemente por debajo del 90% Regrese a la sala de emergencias por falta de aire significativa, vmitos incontrolables, dolor de pecho intenso u otros sntomas preocupantes.      Please read instructions below.  You can alternate Tylenol/acetaminophen and Advil/ibuprofen/Motrin every 4 hours for sore throat, body aches, headache or fever.  You may use Zofran as needed for nausea Drink plenty of water.  Use saline nasal spray for congestion. You can  take Robitussin every 8 hours as needed for cough. Wash your hands frequently. You have a COVID test pending. Please isolate at home while awaiting your results.  > If your test is negative, stay home until your fever has resolved/your symptoms are improving. > If your test is positive, isolate at home for at least 14 days after the day your symptoms initially began, and THEN at least 24 hours after you are fever-free without the help of medications AND your symptoms are improving.  > If your test is positive, the [facility] infusion clinic will contact you to discuss scheduling an infusion as a possible treatment for COVID. Your chest xray does show that you have a pneumonia infection, however this is likely due to a virus and is not treated with antibiotics.  This should clear on its own. Follow up with your primary care provider or the post-COVID care clinic at [facility]. You may buy a pulse oximeter which measures the levels of oxygen in your blood, please return to the ER if your levels are consistently below 90% Return to the ER for significant shortness of breath, uncontrollable vomiting, severe chest pain, or other concerning symptoms.

## 2020-07-20 NOTE — ED Triage Notes (Signed)
Pt c/o of couand fever since Monday. Pt was exposed to covid.

## 2020-07-23 ENCOUNTER — Telehealth: Payer: Self-pay | Admitting: Family

## 2020-07-23 NOTE — Telephone Encounter (Signed)
Called to Discuss with patient about Covid symptoms and the use of the monoclonal antibody infusion for those with mild to moderate Covid symptoms and at a high risk of hospitalization.     Pt appears to qualify for this infusion due to co-morbid conditions and/or a member of an at-risk group in accordance with the FDA Emergency Use Authorization.  Kimberly Hicks's primary language is Spanish and a medical interpreter was used to aid in communication.  Kimberly Hicks was seen at the [facility] ED on 12/23 with COVID like symptoms including nausea, vomiting, diarrhea, cough and fevers. She is unvaccinated. Qualifying risk factors include BMI >25 (39) and high risk social situation.   Interpreter was unable to reach her and mailbox was full. Unable to leave MyChart message as it was not set up.    [name], NP 07/23/2020 9:31 AM

## 2020-08-22 ENCOUNTER — Other Ambulatory Visit (HOSPITAL_COMMUNITY): Payer: Self-pay | Admitting: *Deleted

## 2020-08-22 DIAGNOSIS — Z1231 Encounter for screening mammogram for malignant neoplasm of breast: Secondary | ICD-10-CM

## 2020-09-07 ENCOUNTER — Other Ambulatory Visit: Payer: Self-pay

## 2020-09-07 ENCOUNTER — Ambulatory Visit (HOSPITAL_COMMUNITY)
Admission: RE | Admit: 2020-09-07 | Discharge: 2020-09-07 | Disposition: A | Discharge status: Home / Self Care | Payer: PRIVATE HEALTH INSURANCE | Source: Ambulatory Visit | Attending: *Deleted | Admitting: *Deleted

## 2020-09-07 DIAGNOSIS — Z1231 Encounter for screening mammogram for malignant neoplasm of breast: Secondary | ICD-10-CM | POA: Insufficient documentation

## 2021-11-19 IMAGING — MG MM DIGITAL SCREENING BILAT W/ TOMO AND CAD
8 series · 8 of 24 positions shown · non-contrast
Comparison: Previous exam(s).

CLINICAL DATA: Screening.

EXAM:
DIGITAL SCREENING BILATERAL MAMMOGRAM WITH TOMOSYNTHESIS AND CAD
TECHNIQUE: Bilateral screening digital craniocaudal and mediolateral oblique
mammograms were obtained. Bilateral screening digital breast
tomosynthesis was performed. The images were evaluated with
computer-aided detection.

[R MLO synth-2D]
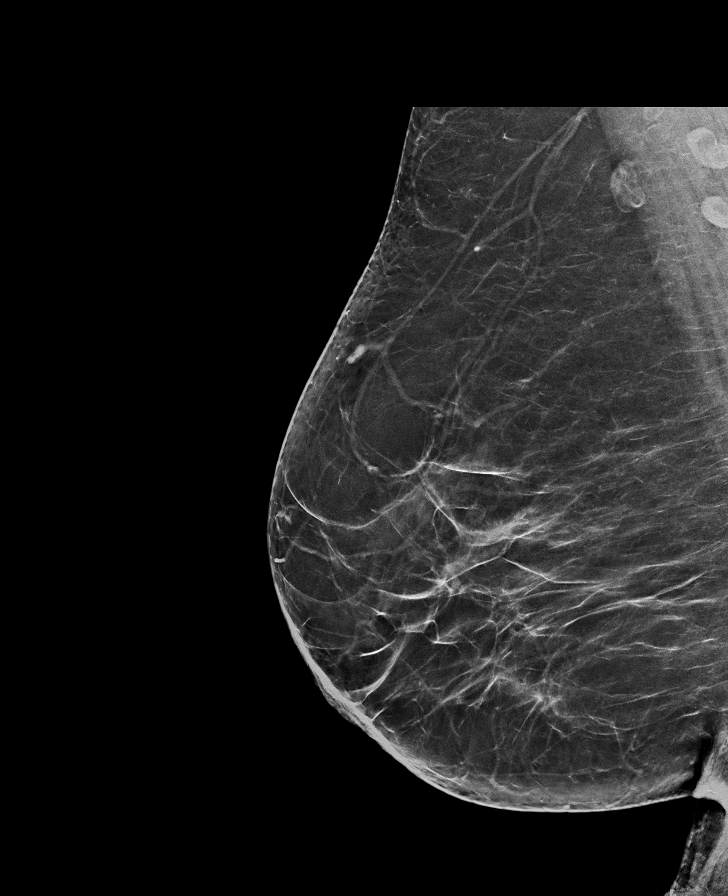

[L MLO synth-2D]
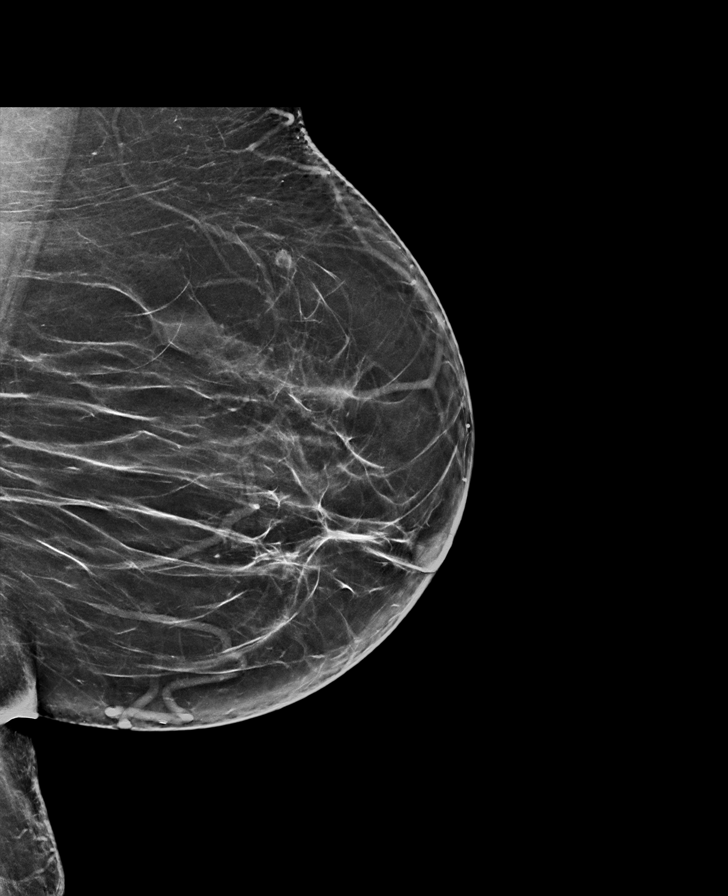

[L CC synth-2D]
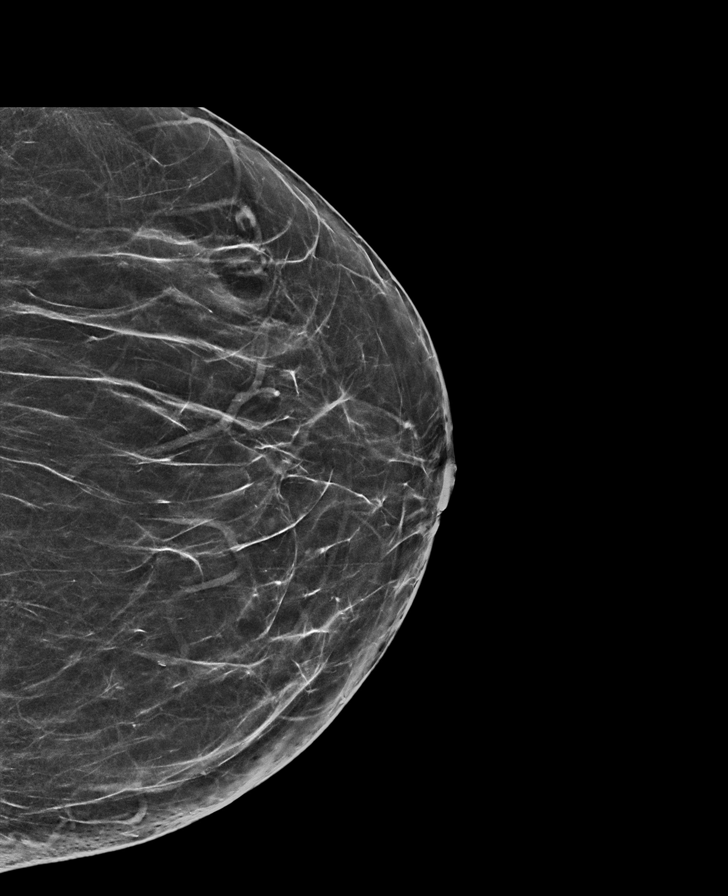

[R CC synth-2D]
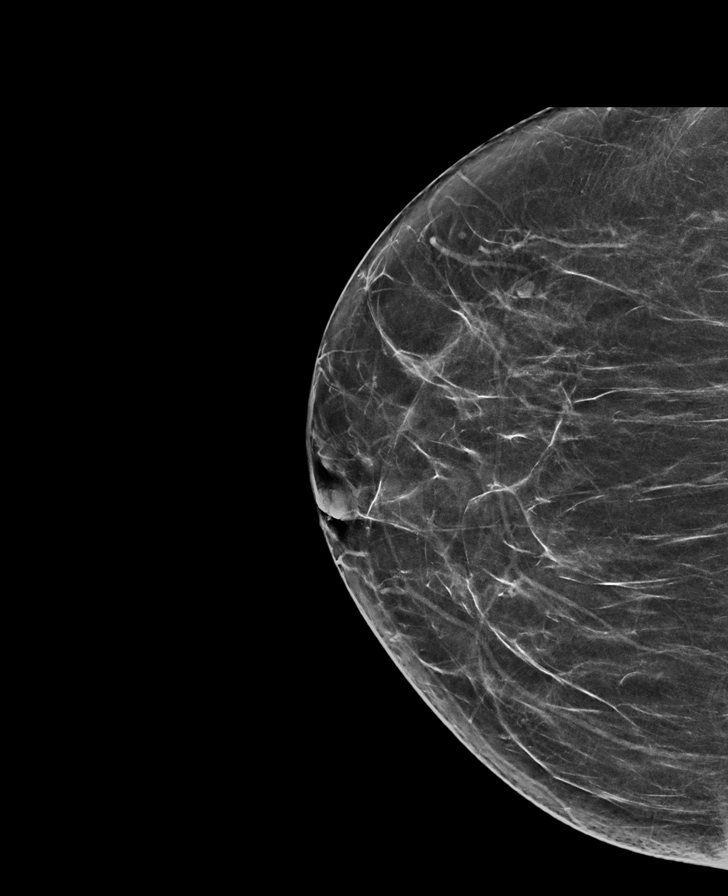

[L MLO tomo · tomo slice 37/74.0]
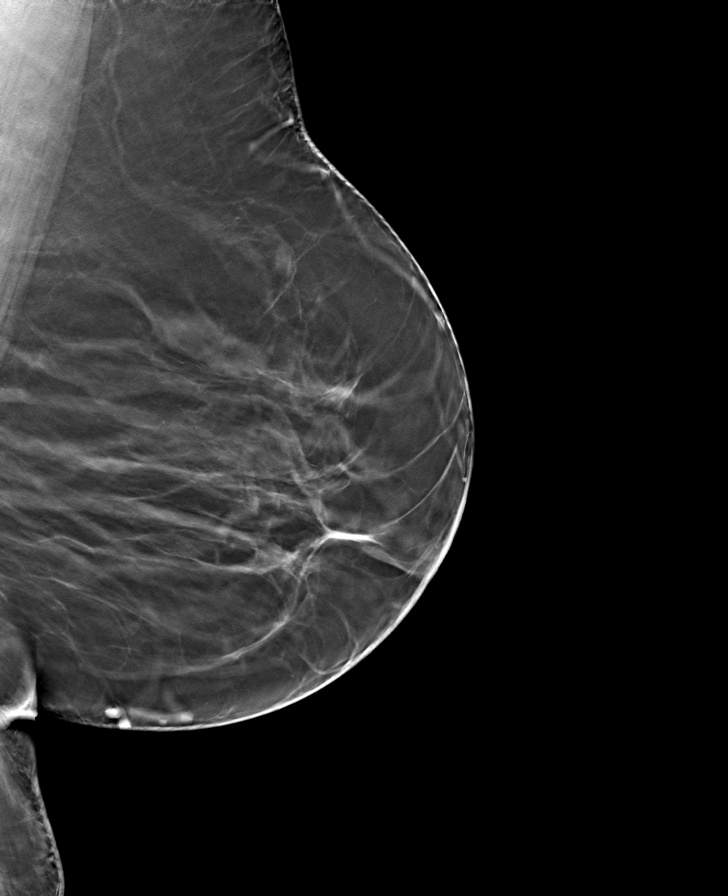

[L CC tomo · tomo slice 33/65.0]
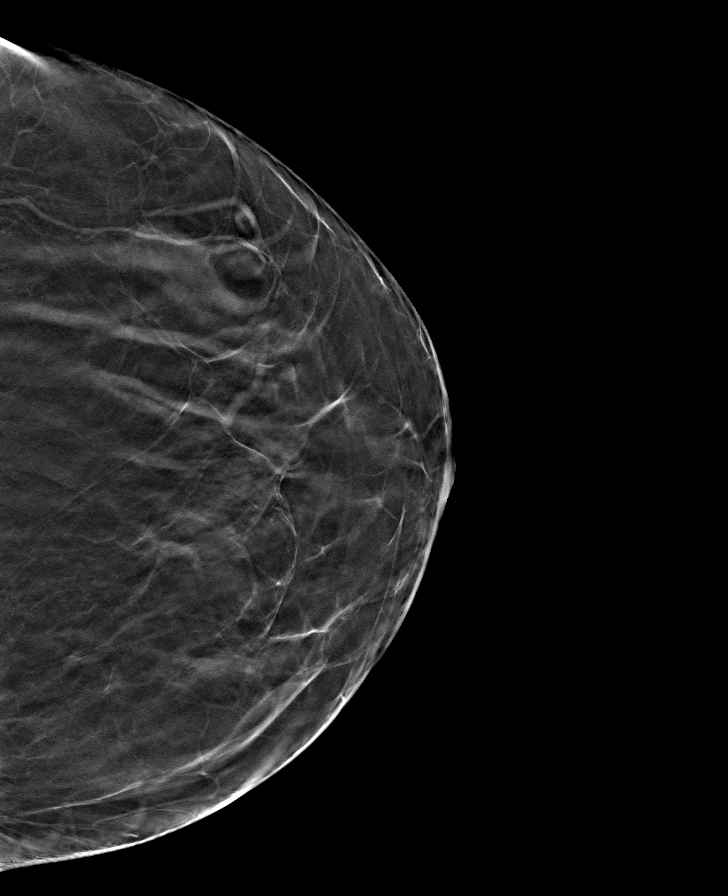

[R MLO tomo · tomo slice 39/78.0]
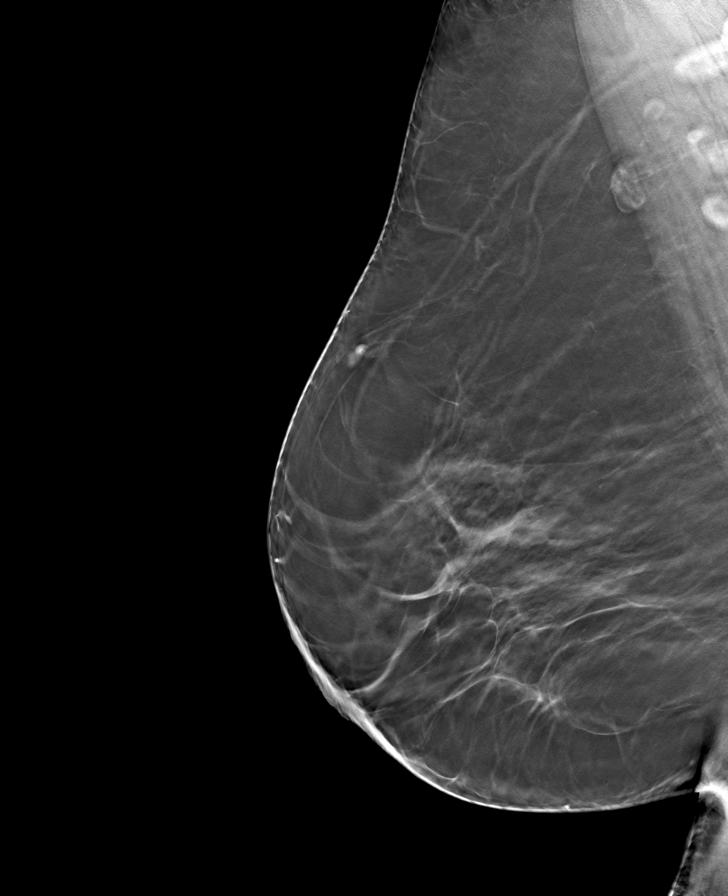

[R CC tomo · tomo slice 33/66.0]
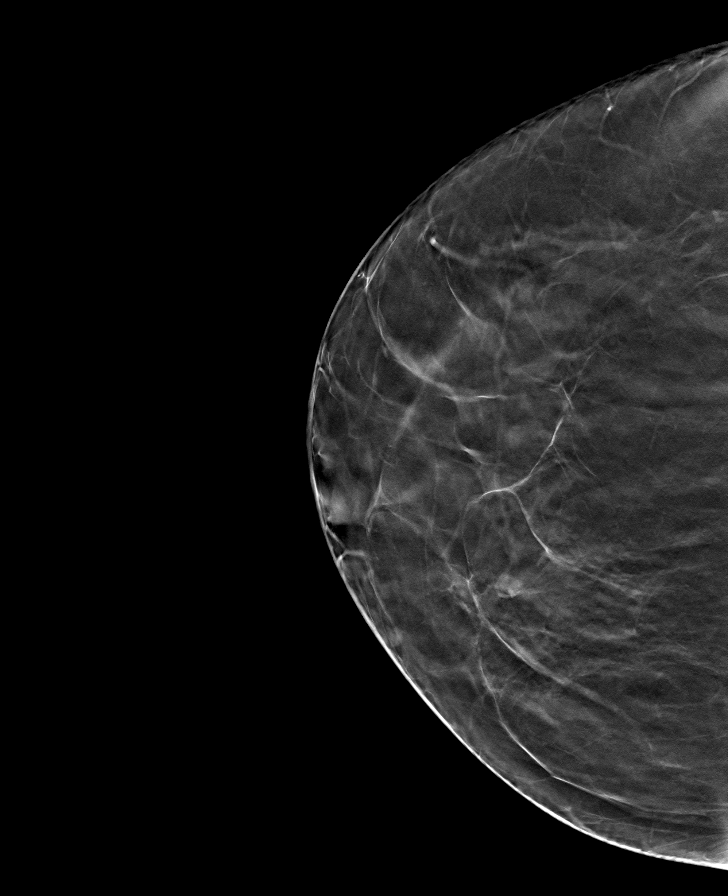

[8 of 24 positions shown; findings below may reference images not displayed]

ACR Breast Density Category b: There are scattered areas of
fibroglandular density.
FINDINGS: There are no findings suspicious for malignancy.
IMPRESSION: No mammographic evidence of malignancy. A result letter of this
screening mammogram will be mailed directly to the patient.

RECOMMENDATION:
Screening mammogram in one year. (Code:51-O-LD2)

BI-RADS CATEGORY  1: Negative.

## 2023-04-14 ENCOUNTER — Telehealth: Payer: Self-pay

## 2023-04-14 NOTE — Telephone Encounter (Signed)
Wellness check-in call was completed with patient by phone on 9.12.24  Pt  denies any immediate socio determinant health needs as it relates to accessing medical care, medications, transportation and/or behavioral health needs.   Pt states she has been maintaining her appointments with her current PCP at the [facility] and continues to be pleased with services.     Her last PCP follow up was on 6.17.24 with a next appt scheduled for 9.20.24.  Plan Pt was advised to follow up as needed for any info related to medical or social determinant needs and was was provided with my contact information.   2.  Reviewed with pt on utilization of PCP versus urgent care or emergency room visits and the timeline in which to utilize  Pt stated she understood the information shared and call then ended

## 2023-08-26 LAB — GLUCOSE, POCT (MANUAL RESULT ENTRY): POC Glucose: 97 mg/dL (ref 70–99)

## 2023-08-26 NOTE — Congregational Nurse Program (Signed)
Completed a Wellness check-in with patient as a result of her completion of RENEWAL enrollment with the [facility].   Pt verified her current medical conditions of pre-diabetes and hyperthyroidism, high cholesterol and GERD .   Pt admits to not checking her blood glucose for prevention purposes as it relates to her pre-diabetes and maintaining a log to be able to report to her PCP.     She states she has no glucometer at home, but has inquired before at local pharmacy and feels as though the strips are too costly.   Pt maintains consistency with staying connected with her PCP at the [facility].   Her last PCP 6 month follow up was on 6.17.24 with a next appt scheduled for today (1.28.25) at 11:00am  Pt denies any immediate socio-determinant health needs as it relates to accessing medical care, medications, transportation and/or behavioral health needs at this time  Action Plan  Conducted a blood glucose screening due to pt PCP diagnoses of pre-diabetes.on today   Her screening result was WNL (97-fasting)   Pt was advised to follow up as needed for any info related to medical or social determinant needs  3.   Provided with fact sheet of knowing the difference between pre-diabetes and diabetes and the reference ranges to review            when she decides to check her blood glucoses.  In addition a log sheet was provided to pt along with the instructions on logging the numbers for when a  glucometer is obtained  4.    Pt advised to take log sheet to her followup appts to her PCP to always keep them updated and connected with her care  5.   Reviewed with pt on utilization of PCP versus urgent care or emergency room visits and the timeline in which to utilize  Pt stated she understood with the assistance of her son Lars Mage) as her requested interpreter about the information shared

## 2023-10-14 ENCOUNTER — Emergency Department (HOSPITAL_COMMUNITY): Payer: Self-pay

## 2023-10-14 ENCOUNTER — Emergency Department (HOSPITAL_COMMUNITY)
Admission: EM | Admit: 2023-10-14 | Discharge: 2023-10-14 | Disposition: A | Discharge status: Home / Self Care | Payer: Self-pay | Attending: Emergency Medicine | Admitting: Emergency Medicine

## 2023-10-14 ENCOUNTER — Other Ambulatory Visit: Payer: Self-pay

## 2023-10-14 ENCOUNTER — Encounter (HOSPITAL_COMMUNITY): Payer: Self-pay | Admitting: *Deleted

## 2023-10-14 DIAGNOSIS — E039 Hypothyroidism, unspecified: Secondary | ICD-10-CM | POA: Insufficient documentation

## 2023-10-14 DIAGNOSIS — R1084 Generalized abdominal pain: Secondary | ICD-10-CM | POA: Insufficient documentation

## 2023-10-14 DIAGNOSIS — E119 Type 2 diabetes mellitus without complications: Secondary | ICD-10-CM | POA: Insufficient documentation

## 2023-10-14 DIAGNOSIS — Z79899 Other long term (current) drug therapy: Secondary | ICD-10-CM | POA: Insufficient documentation

## 2023-10-14 LAB — COMPREHENSIVE METABOLIC PANEL
ALT: 32 U/L (ref 0–44)
AST: 25 U/L (ref 15–41)
Albumin: 4 g/dL (ref 3.5–5.0)
Alkaline Phosphatase: 82 U/L (ref 38–126)
Anion gap: 10 (ref 5–15)
BUN: 16 mg/dL (ref 6–20)
CO2: 25 mmol/L (ref 22–32)
Calcium: 9.7 mg/dL (ref 8.9–10.3)
Chloride: 106 mmol/L (ref 98–111)
Creatinine, Ser: 0.48 mg/dL (ref 0.44–1.00)
GFR, Estimated: >60 mL/min (ref >60)
Glucose, Bld: 79 mg/dL (ref 70–99)
Potassium: 4.1 mmol/L (ref 3.5–5.1)
Sodium: 141 mmol/L (ref 135–145)
Total Bilirubin: 0.4 mg/dL (ref 0.0–1.2)
Total Protein: 7.3 g/dL (ref 6.5–8.1)

## 2023-10-14 LAB — URINALYSIS, ROUTINE W REFLEX MICROSCOPIC
Bilirubin Urine: NEGATIVE
Glucose, UA: NEGATIVE mg/dL
Hgb urine dipstick: NEGATIVE
Ketones, ur: NEGATIVE mg/dL
Leukocytes,Ua: NEGATIVE
Nitrite: NEGATIVE
Protein, ur: NEGATIVE mg/dL
Specific Gravity, Urine: 1.009 (ref 1.005–1.030)
pH: 8 (ref 5.0–8.0)

## 2023-10-14 LAB — CBC
HCT: 35 % — ABNORMAL LOW (ref 36.0–46.0)
Hemoglobin: 11.9 g/dL — ABNORMAL LOW (ref 12.0–15.0)
MCH: 29.9 pg (ref 26.0–34.0)
MCHC: 34 g/dL (ref 30.0–36.0)
MCV: 87.9 fL (ref 80.0–100.0)
Platelets: 308 10*3/uL (ref 150–400)
RBC: 3.98 MIL/uL (ref 3.87–5.11)
RDW: 13.5 % (ref 11.5–15.5)
WBC: 6.4 10*3/uL (ref 4.0–10.5)
nRBC: 0 % (ref 0.0–0.2)

## 2023-10-14 LAB — LIPASE, BLOOD: Lipase: 36 U/L (ref 11–51)

## 2023-10-14 MED ORDER — SODIUM CHLORIDE 0.9 % IV BOLUS
500.0000 mL | Freq: Once | INTRAVENOUS | Status: AC
  Administered 2023-10-14: 500 mL via INTRAVENOUS

## 2023-10-14 MED ORDER — ONDANSETRON HCL 4 MG/2ML IJ SOLN
4.0000 mg | Freq: Once | INTRAMUSCULAR | Status: AC
  Administered 2023-10-14: 4 mg via INTRAVENOUS
  Filled 2023-10-14: qty 2

## 2023-10-14 MED ORDER — PANTOPRAZOLE SODIUM 40 MG PO TBEC: 40.0000 mg | DELAYED_RELEASE_TABLET | Freq: Every day | ORAL | 0 refills | Status: DC

## 2023-10-14 MED ORDER — KETOROLAC TROMETHAMINE 15 MG/ML IJ SOLN
15.0000 mg | Freq: Once | INTRAMUSCULAR | Status: AC
  Administered 2023-10-14: 15 mg via INTRAVENOUS
  Filled 2023-10-14: qty 1

## 2023-10-14 MED ORDER — IOHEXOL 300 MG/ML  SOLN
100.0000 mL | Freq: Once | INTRAMUSCULAR | Status: AC | PRN
  Administered 2023-10-14: 100 mL via INTRAVENOUS

## 2023-10-14 MED ORDER — FAMOTIDINE IN NACL 20-0.9 MG/50ML-% IV SOLN
20.0000 mg | Freq: Once | INTRAVENOUS | Status: AC
  Administered 2023-10-14: 20 mg via INTRAVENOUS
  Filled 2023-10-14: qty 50

## 2023-10-14 NOTE — ED Triage Notes (Signed)
 Pt abd pain x 1 week, constant and burning. + nausea Pt seen at [facility] in [place] and sent here for gallbladder workup

## 2023-10-14 NOTE — Discharge Instructions (Addendum)
 You are seen in the ER today for abdominal pain.  Your CT scan was reassuring and your lab work was reassuring, your CT scan of your head was also normal.  We are to start you on medicine to decrease the acid production in your stomach, I feel your symptoms may be due to irritation of your stomach lining.  Avoid spicy or greasy foods and follow-up with your PCP.  If you are not having improvement you need to follow-up with the stomach doctor.  His information is on your discharge paperwork.  If you have new or worsening symptoms come back to the ER right away.  I also advised to start taking allergy medicine such as Zyrtec every day, this could be contributing to your headaches.

## 2023-10-14 NOTE — ED Provider Notes (Signed)
 [name] Provider Note   CSN: [id number] Arrival date & time: 10/14/23  1707     History  Chief Complaint  Patient presents with   Abdominal Pain    Kimberly Hicks is a 58 y.o. female.  History of hypothyroidism, diabetes and high cholesterol.  She presents the ER today complaining of diffuse upper abdominal pain x 1 week described as burning, worse after eating, pain does not radiate.  No chest pain or shortness of breath.  Pain is not exertional, not associated with sweating.  No vomiting or diarrhea but she has had nausea.  She has not been constipated.  She denies urinary symptoms.  She also reports she has had a frontal headache described as throbbing and constant x 1 week.  She normally has headaches that are mild and get with over-the-counter medications but this headache has been persistent.  She denies any fevers, neck pain, blurry vision, numbness tingling or weakness or other associated symptoms.  Denies head injury. Was seen at urgent care today advised to come the ER, they felt symptoms may be related to her gallbladder and felt she needed further workup for her symptoms today.    Abdominal Pain      Home Medications Prior to Admission medications   Medication Sig Start Date End Date Taking? Authorizing Provider  pantoprazole (PROTONIX) 40 MG tablet Take 1 tablet (40 mg total) by mouth daily. 10/14/23  Yes Saheed Carrington A, PA-C  Alum & Mag Hydroxide-Simeth (MAGIC MOUTHWASH) SOLN Take 5 mLs by mouth 4 (four) times daily as needed for mouth pain. 08/05/13   Deatra Canter, FNP  amoxicillin-clavulanate (AUGMENTIN) 875-125 MG per tablet Take 1 tablet by mouth 2 (two) times daily. 09/05/14   Deatra Canter, FNP  ciprofloxacin (CIPRO) 500 MG tablet Take 1 tablet (500 mg total) by mouth 2 (two) times daily. 09/02/14   Deatra Canter, FNP  famciclovir (FAMVIR) 500 MG tablet Take 1 tablet (500 mg total) by mouth 2 (two) times  daily. 08/05/13   Deatra Canter, FNP  HYDROcodone-acetaminophen (NORCO) 5-325 MG per tablet Take 1 tablet by mouth every 6 (six) hours as needed for moderate pain. 08/12/13   Deatra Canter, FNP  hydrOXYzine (ATARAX/VISTARIL) 25 MG tablet Take 1 tablet (25 mg total) by mouth 3 (three) times daily as needed. 09/06/14   Deatra Canter, FNP  ibuprofen (ADVIL,MOTRIN) 800 MG tablet Take 800 mg by mouth 3 (three) times daily.    [provider]  levothyroxine (SYNTHROID, LEVOTHROID) 25 MCG tablet Take 25 mcg by mouth daily before breakfast.    [provider]  methylPREDNIsolone (MEDROL DOSPACK) 4 MG tablet follow package directions 09/06/14   Deatra Canter, FNP  ondansetron (ZOFRAN ODT) 4 MG disintegrating tablet Take 1 tablet (4 mg total) by mouth every 8 (eight) hours as needed for nausea or vomiting. 07/20/20   Mare Ferrari, PA-C  Oxcarbazepine (TRILEPTAL) 300 MG tablet Take 1 tablet (300 mg total) by mouth 2 (two) times daily. 08/12/13   Deatra Canter, FNP      Allergies    Polytrim [polymyxin b-trimethoprim] and Ciprofloxacin    Review of Systems   Review of Systems  Gastrointestinal:  Positive for abdominal pain.    Physical Exam Updated Vital Signs BP 120/68   Pulse 63   Temp 98.5 F (36.9 C)   Resp 18   Ht 5\' 1"  (1.549 m)   Wt 93.9 kg  SpO2 97%   BMI 39.11 kg/m  Physical Exam Vitals and nursing note reviewed.  Constitutional:      General: She is not in acute distress.    Appearance: She is well-developed.  HENT:     Head: Normocephalic and atraumatic.  Eyes:     Extraocular Movements: Extraocular movements intact.     Conjunctiva/sclera: Conjunctivae normal.     Pupils: Pupils are equal, round, and reactive to light.  Cardiovascular:     Rate and Rhythm: Normal rate and regular rhythm.     Heart sounds: No murmur heard. Pulmonary:     Effort: Pulmonary effort is normal. No respiratory distress.     Breath sounds: Normal breath sounds.   Abdominal:     Palpations: Abdomen is soft.     Tenderness: There is abdominal tenderness in the right upper quadrant and epigastric area. There is no right CVA tenderness or left CVA tenderness.  Musculoskeletal:        General: No swelling.     Cervical back: Full passive range of motion without pain and neck supple.  Skin:    General: Skin is warm and dry.     Capillary Refill: Capillary refill takes less than 2 seconds.  Neurological:     General: No focal deficit present.     Mental Status: She is alert.     GCS: GCS eye subscore is 4. GCS verbal subscore is 5. GCS motor subscore is 6.     Cranial Nerves: Cranial nerves 2-12 are intact.     Sensory: Sensation is intact.     Motor: Motor function is intact. No weakness or tremor.     Coordination: Coordination is intact.     Gait: Gait is intact.  Psychiatric:        Mood and Affect: Mood normal.     ED Results / Procedures / Treatments   Labs (all labs ordered are listed, but only abnormal results are displayed) Labs Reviewed  CBC - Abnormal; Notable for the following components:      Result Value   Hemoglobin 11.9 (*)    HCT 35.0 (*)    All other components within normal limits  URINALYSIS, ROUTINE W REFLEX MICROSCOPIC - Abnormal; Notable for the following components:   Color, Urine STRAW (*)    All other components within normal limits  LIPASE, BLOOD  COMPREHENSIVE METABOLIC PANEL    EKG None  Radiology CT ABDOMEN PELVIS W CONTRAST Result Date: 10/14/2023 CLINICAL DATA:  Abdominal pain and nausea x1 week. EXAM: CT ABDOMEN AND PELVIS WITH CONTRAST TECHNIQUE: Multidetector CT imaging of the abdomen and pelvis was performed using the standard protocol following bolus administration of intravenous contrast. RADIATION DOSE REDUCTION: This exam was performed according to the departmental dose-optimization program which includes automated exposure control, adjustment of the mA and/or kV according to patient size and/or  use of iterative reconstruction technique. CONTRAST:  OMNIPAQUE IOHEXOL 300 MG/ML  SOLN COMPARISON:  None Available. FINDINGS: Lower chest: No acute abnormality. Hepatobiliary: A 3.3 cm simple cyst is seen within the caudate lobe of the liver. An additional 2.8 cm simple cyst is noted within the posterior aspect of the right lobe of the liver. No gallstones, gallbladder wall thickening, or biliary dilatation. Pancreas: Unremarkable. No pancreatic ductal dilatation or surrounding inflammatory changes. Spleen: Normal in size without focal abnormality. Adrenals/Urinary Tract: Adrenal glands are unremarkable. Kidneys are normal in size, without renal calculi or hydronephrosis. Cortical scarring is seen along the posterolateral aspect  of the left kidney. Bladder is unremarkable. Stomach/Bowel: Stomach is within normal limits. Appendix appears normal. Stool is seen throughout the large bowel. No evidence of bowel wall thickening, distention, or inflammatory changes. Vascular/Lymphatic: Very mild aortic atherosclerosis. No enlarged abdominal or pelvic lymph nodes. Reproductive: Uterus and bilateral adnexa are unremarkable. Other: No abdominal wall hernia or abnormality. No abdominopelvic ascites. Musculoskeletal: No acute or significant osseous findings. IMPRESSION: 1. No acute or active process within the abdomen or pelvis. 2. Hepatic cysts. 3. Aortic atherosclerosis. Aortic Atherosclerosis (ICD10-I70.0). Electronically Signed   By: Aram Candela M.D.   On: 10/14/2023 22:49   CT Head Wo Contrast Result Date: 10/14/2023 CLINICAL DATA:  Headache EXAM: CT HEAD WITHOUT CONTRAST TECHNIQUE: Contiguous axial images were obtained from the base of the skull through the vertex without intravenous contrast. RADIATION DOSE REDUCTION: This exam was performed according to the departmental dose-optimization program which includes automated exposure control, adjustment of the mA and/or kV according to patient size and/or use  of iterative reconstruction technique. COMPARISON:  None Available. FINDINGS: Brain: No acute intracranial abnormality. Specifically, no hemorrhage, hydrocephalus, mass lesion, acute infarction, or significant intracranial injury. Vascular: No hyperdense vessel or unexpected calcification. Skull: No acute calvarial abnormality. Sinuses/Orbits: No acute findings Other: None IMPRESSION: Normal study. Electronically Signed   By: Charlett Nose M.D.   On: 10/14/2023 22:47    Procedures Procedures    Medications Ordered in ED Medications  ondansetron (ZOFRAN) injection 4 mg (4 mg Intravenous Given 10/14/23 2120)  famotidine (PEPCID) IVPB 20 mg premix (0 mg Intravenous Stopped 10/14/23 2150)  ketorolac (TORADOL) 15 MG/ML injection 15 mg (15 mg Intravenous Given 10/14/23 2120)  sodium chloride 0.9 % bolus 500 mL (0 mLs Intravenous Stopped 10/14/23 2149)  iohexol (OMNIPAQUE) 300 MG/ML solution 100 mL (100 mLs Intravenous Contrast Given 10/14/23 2142)    ED Course/ Medical Decision Making/ A&P                                 Medical Decision Making This patient presents to the ED for concern of 1 week of upper abdominal pain described as burning and headaches, this involves an extensive number of treatment options, and is a complaint that carries with it a high risk of complications and morbidity.  The differential diagnosis includes gastritis, peptic ulcer disease, cholecystitis, cholelithiasis, pancreatitis, UTI, gastroenteritis; migraine, tension headache, sinusitis, allergic rhinitis, other   Co morbidities that complicate the patient evaluation  Diabetes and hypertension   Additional history obtained:  Additional history obtained from EMR External records from outside source obtained and reviewed including previous notes   Lab Tests:  I Ordered, and personally interpreted labs.  The pertinent results include: CBC, CMP, lipase and urinalysis are normal   Imaging Studies ordered:  I  ordered imaging studies including CT abdomen pelvis and CT head I independently visualized and interpreted imaging which showed no acute findings CT abdomen pelvis, no acute findings CT head I agree with the radiologist interpretation     Problem List / ED Course / Critical interventions / Medication management  Abdominal pain and headache x 1 week-patient had been having burning upper abdominal pain for the past week it is worse after eating but does not radiate, labs and CT are reassuring, she is feeling much better after Pepcid, fluids Zofran and also given Toradol as she was having headaches for the past week as well.  She had no concerning physical  exam findings guarding or headache but has been having persistent pain for a week and is over 50 so CT head ordered and is normal as well, headache resolved with the Toradol. I discussed with patient I suspect she may have gastritis versus possibly peptic ulcer disease, she is tolerating p.o., advised avoiding spicy or greasy foods, will start PPI and have her follow-up with PCP and/or GI.  Regarding headaches, can take OTC Tylenol as directed on packaging and follow-up with PCP closely if symptoms continue.  She is complaining primarily frontal headache described as pressure, discussed this could be related to sinuses and recommended starting allergy meds to see if this improves headaches as she is having some congestion as well. Patient declined use of medical translator and requested her son translate.  He was agreeable with this, all exam findings, lab and imaging findings were relayed to them, they were given opportunity to ask questions and these were answered to their satisfaction.  They were given strict return precautions. I ordered medication including Zofran, Pepcid, Toradol for pain and nausea Reevaluation of the patient after these medicines showed that the patient improved I have reviewed the patients home medicines and have made  adjustments as needed      Amount and/or Complexity of Data Reviewed Labs: ordered. Radiology: ordered.  Risk Prescription drug management.           Final Clinical Impression(s) / ED Diagnoses Final diagnoses:  Generalized abdominal pain    Rx / DC Orders ED Discharge Orders          Ordered    pantoprazole (PROTONIX) 40 MG tablet  Daily        10/14/23 2308              Ma Rings, PA-C 10/14/23 2342    Anders Simmonds T, DO 10/16/23 2321

## 2023-10-23 ENCOUNTER — Telehealth: Payer: Self-pay

## 2023-10-23 NOTE — Telephone Encounter (Signed)
 Completed a hospital f/u nurse case management  call as a result of the pt being seen at the [facility] ER on 3.18.25.  I  successfully spoke with patient by way of [employer]   She states her medical issue with her stomach as returned and has not resolved since her ER visit.  She states she was advised to connect with a GI specialist to receive further evaluation.   She states she has been waiting to get a bill first to submit that will assist with making her co-payment for the appointment less expensive.  Pt states outside of there medical condition concern she has no other concerns as it  relates to medical or socio-determinant needs of health (food, transportation, housing or behavioral health needs.        PLAN -Pt was advised to contact her PCP at the [facility] dept to receive a post hospital follow up evaluation and to get the PCP to make a referral to a GI Specialist and forward to the [facility] '     -Pt was advised thereafter to contact the [facility] office to schedule an appointment to complete a [facility] [employer])  once the referral source is chosen by the PCP   Pt states she understood and advised to contact me back if she has additional questions/concerns  [phone]) and/or our Care Guide (Maricarmen G.. if interpretation services are needed)

## 2023-11-04 NOTE — Progress Notes (Deleted)
 Referring Provider:   Tylene Fantasia., PA-C Primary Care Physician:  Tylene Fantasia., PA-C Primary Gastroenterologist:  Dr. Bonnetta Barry chief complaint on file.   HPI:   Kimberly Hicks is a 58 y.o. female presenting today at the request of   Muse, Rochelle D., PA-C for generalized abdominal pain.   Patient was seen in the emergency room 10/14/2023 with complaint of diffuse upper abdominal pain x 1 week that was described as burning, worse after eating.  Also with constant headache x 1 week.  Laboratory evaluation with no significant abnormalities.  Hemoglobin was just slightly low at 11.9, normocytic indices.  LFTs within normal limits.  CT head was normal.  CT A/P with contrast with no acute process.  She did have 2 simple hepatic cyst.  She received Pepcid, fluids, Zofran, Toradol in the ER with clinical improvement.  She was prescribed Protonix 40 mg daily at discharge.  Per referral from PCP, patient has history of H. pylori infection in September 2023. Labs included in referral from 08/26/2023 with hemoglobin 12.7.   Today:   Past Medical History:  Diagnosis Date   Hypothyroidism     Past Surgical History:  Procedure Laterality Date   CESAREAN SECTION     TUMOR EXCISION      Current Outpatient Medications  Medication Sig Dispense Refill   Alum & Mag Hydroxide-Simeth (MAGIC MOUTHWASH) SOLN Take 5 mLs by mouth 4 (four) times daily as needed for mouth pain. 120 mL 0   amoxicillin-clavulanate (AUGMENTIN) 875-125 MG per tablet Take 1 tablet by mouth 2 (two) times daily. 20 tablet 0   ciprofloxacin (CIPRO) 500 MG tablet Take 1 tablet (500 mg total) by mouth 2 (two) times daily. 20 tablet 0   famciclovir (FAMVIR) 500 MG tablet Take 1 tablet (500 mg total) by mouth 2 (two) times daily. 14 tablet 0   HYDROcodone-acetaminophen (NORCO) 5-325 MG per tablet Take 1 tablet by mouth every 6 (six) hours as needed for moderate pain. 30 tablet 0   hydrOXYzine (ATARAX/VISTARIL) 25 MG tablet  Take 1 tablet (25 mg total) by mouth 3 (three) times daily as needed. 30 tablet 0   ibuprofen (ADVIL,MOTRIN) 800 MG tablet Take 800 mg by mouth 3 (three) times daily.     levothyroxine (SYNTHROID, LEVOTHROID) 25 MCG tablet Take 25 mcg by mouth daily before breakfast.     methylPREDNIsolone (MEDROL DOSPACK) 4 MG tablet follow package directions 21 tablet 0   ondansetron (ZOFRAN ODT) 4 MG disintegrating tablet Take 1 tablet (4 mg total) by mouth every 8 (eight) hours as needed for nausea or vomiting. 20 tablet 0   Oxcarbazepine (TRILEPTAL) 300 MG tablet Take 1 tablet (300 mg total) by mouth 2 (two) times daily. 60 tablet 1   pantoprazole (PROTONIX) 40 MG tablet Take 1 tablet (40 mg total) by mouth daily. 30 tablet 0   No current facility-administered medications for this visit.    Allergies as of 11/06/2023 - Review Complete 10/14/2023  Allergen Reaction Noted   Polytrim [polymyxin b-trimethoprim]  08/12/2013   Ciprofloxacin Rash 09/06/2014    Family History  Problem Relation Age of Onset   Pulmonary embolism Mother    Alcohol abuse Sister     Social History   Socioeconomic History   Marital status: Married    Spouse name: Not on file   Number of children: Not on file   Years of education: Not on file   Highest education level: Not on file  Occupational  History   Not on file  Tobacco Use   Smoking status: Never   Smokeless tobacco: Never  Vaping Use   Vaping status: Never Used  Substance and Sexual Activity   Alcohol use: No   Drug use: No   Sexual activity: Not on file  Other Topics Concern   Not on file  Social History Narrative   Not on file   Social Drivers of Health   Financial Resource Strain: Not on file  Food Insecurity: Not on file  Transportation Needs: Not on file  Physical Activity: Not on file  Stress: Not on file  Social Connections: Not on file  Intimate Partner Violence: Not on file    Review of Systems: Gen: Denies any fever, chills, fatigue,  weight loss, lack of appetite.  CV: Denies chest pain, heart palpitations, peripheral edema, syncope.  Resp: Denies shortness of breath at rest or with exertion. Denies wheezing or cough.  GI: Denies dysphagia or odynophagia. Denies jaundice, hematemesis, fecal incontinence. GU : Denies urinary burning, urinary frequency, urinary hesitancy MS: Denies joint pain, muscle weakness, cramps, or limitation of movement.  Derm: Denies rash, itching, dry skin Psych: Denies depression, anxiety, memory loss, and confusion Heme: Denies bruising, bleeding, and enlarged lymph nodes.  Physical Exam: There were no vitals taken for this visit. General:   Alert and oriented. Pleasant and cooperative. Well-nourished and well-developed.  Head:  Normocephalic and atraumatic. Eyes:  Without icterus, sclera clear and conjunctiva pink.  Ears:  Normal auditory acuity. Lungs:  Clear to auscultation bilaterally. No wheezes, rales, or rhonchi. No distress.  Heart:  S1, S2 present without murmurs appreciated.  Abdomen:  +BS, soft, non-tender and non-distended. No HSM noted. No guarding or rebound. No masses appreciated.  Rectal:  Deferred  Msk:  Symmetrical without gross deformities. Normal posture. Extremities:  Without edema. Neurologic:  Alert and  oriented x4;  grossly normal neurologically. Skin:  Intact without significant lesions or rashes. Psych:  Alert and cooperative. Normal mood and affect.    Assessment:     Plan:  ***   Ermalinda Memos, PA-C [facility] 11/06/2023

## 2023-11-06 ENCOUNTER — Ambulatory Visit: Payer: Self-pay | Admitting: Gastroenterology

## 2023-11-17 ENCOUNTER — Ambulatory Visit: Payer: Self-pay | Admitting: Gastroenterology

## 2023-11-18 ENCOUNTER — Encounter: Payer: Self-pay | Admitting: Gastroenterology

## 2023-11-18 ENCOUNTER — Encounter: Payer: Self-pay | Admitting: *Deleted

## 2023-11-18 ENCOUNTER — Ambulatory Visit (INDEPENDENT_AMBULATORY_CARE_PROVIDER_SITE_OTHER): Payer: Self-pay | Admitting: Gastroenterology

## 2023-11-18 VITALS — BP 127/76 | HR 67 | Temp 97.9°F | Ht 62.0 in | Wt 210.0 lb

## 2023-11-18 DIAGNOSIS — R1319 Other dysphagia: Secondary | ICD-10-CM

## 2023-11-18 DIAGNOSIS — R1011 Right upper quadrant pain: Secondary | ICD-10-CM | POA: Insufficient documentation

## 2023-11-18 DIAGNOSIS — K7689 Other specified diseases of liver: Secondary | ICD-10-CM | POA: Insufficient documentation

## 2023-11-18 DIAGNOSIS — R1013 Epigastric pain: Secondary | ICD-10-CM | POA: Insufficient documentation

## 2023-11-18 DIAGNOSIS — R1310 Dysphagia, unspecified: Secondary | ICD-10-CM | POA: Insufficient documentation

## 2023-11-18 DIAGNOSIS — R110 Nausea: Secondary | ICD-10-CM

## 2023-11-18 DIAGNOSIS — D649 Anemia, unspecified: Secondary | ICD-10-CM

## 2023-11-18 NOTE — H&P (View-Only) (Signed)
 GI Office Note    Referring Provider: Bolivar Bushman., PA-C Primary Care Physician:  Bolivar Bushman., PA-C  Primary Gastroenterologist: Rolando Cliche. Mordechai April, DO   Chief Complaint   Chief Complaint  Patient presents with   Abdominal Pain    Was seen recently in the ED     History of Present Illness   Kimberly Hicks is a 58 y.o. female presenting today at the request of Mercedes Stake, PA-C [facility] health department for generalized abdominal pain, GERD, nausea, hepatic cyst, history of H. pylori.  Age 48, while in [place], had gastritis. H. pylori infection September 2023, treated by PCP.  Several months of epigastric pain but progressively getting worse. Associated with nausea. Lately pain also in RUQ and goes down to RLQ and across lower abdomen. Definitely worse in epigastric region. No heartburn. She has some solid food dysphagia, has to wash food down with water. She had cut back on dairy and acidic foods without relief. In wake up with abdominal pain, she notes eating can help pain. But if she is having no pain, then eating can bring pain on. No specific food triggers. Chronic constipation but little better lately. Lately BM 1-2 times per day.No rectal bleeding. No melena. Notes Pepcid  and pantoprazole  helps some.  CT abdomen pelvis with contrast October 14, 2023: IMPRESSION: 1. No acute or active process within the abdomen or pelvis. 2. Hepatic cysts. 3. Aortic atherosclerosis.  Colonoscopy around 2003 in [place].  Medications   Current Outpatient Medications  Medication Sig Dispense Refill   cetirizine (ZYRTEC) 10 MG tablet Take 1 tablet by mouth daily.     Cholecalciferol (VITAMIN D-3) 125 MCG (5000 UT) TABS Take 1 tablet by mouth daily.     famotidine  (PEPCID ) 20 MG tablet Take 20 mg by mouth at bedtime.     fenofibrate (TRICOR) 145 MG tablet Take 145 mg by mouth daily.     fluticasone (FLONASE) 50 MCG/ACT nasal spray Place into both nostrils daily.      levothyroxine (SYNTHROID) 88 MCG tablet Take 88 mcg by mouth daily before breakfast.     ondansetron  (ZOFRAN ) 4 MG tablet Take 4 mg by mouth every 8 (eight) hours as needed.     pantoprazole  (PROTONIX ) 40 MG tablet Take 1 tablet (40 mg total) by mouth daily. 30 tablet 0   No current facility-administered medications for this visit.    Allergies   Allergies as of 11/18/2023 - Review Complete 11/18/2023  Allergen Reaction Noted   Polymyxin b   08/12/2013   Polytrim  [polymyxin b -trimethoprim ]  08/12/2013   Ciprofloxacin  Rash 09/06/2014    Past Medical History   Past Medical History:  Diagnosis Date   Hyperlipemia    Hypothyroidism    Seasonal allergies     Past Surgical History   Past Surgical History:  Procedure Laterality Date   CESAREAN SECTION     twice   TUMOR EXCISION      Past Family History   Family History  Problem Relation Age of Onset   Pulmonary embolism Mother    Alcohol abuse Sister    Colon cancer Neg Hx    Gastric cancer Neg Hx     Past Social History   Social History   Socioeconomic History   Marital status: Married    Spouse name: Not on file   Number of children: Not on file   Years of education: Not on file   Highest education level: Not on file  Occupational History   Not on file  Tobacco Use   Smoking status: Never   Smokeless tobacco: Never  Vaping Use   Vaping status: Never Used  Substance and Sexual Activity   Alcohol use: No   Drug use: No   Sexual activity: Not on file  Other Topics Concern   Not on file  Social History Narrative   Not on file   Social Drivers of Health   Financial Resource Strain: Not on file  Food Insecurity: Not on file  Transportation Needs: Not on file  Physical Activity: Not on file  Stress: Not on file  Social Connections: Not on file  Intimate Partner Violence: Not on file    Review of Systems   General: Negative for anorexia, weight loss, fever, chills, fatigue, weakness. Eyes:  Negative for vision changes.  ENT: Negative for hoarseness,  nasal congestion. See hpi CV: Negative for chest pain, angina, palpitations, dyspnea on exertion, peripheral edema.  Respiratory: Negative for dyspnea at rest, dyspnea on exertion, cough, sputum, wheezing.  GI: See history of present illness. GU:  Negative for dysuria, hematuria, urinary incontinence, urinary frequency, nocturnal urination.  MS: Negative for joint pain, low back pain.  Derm: Negative for rash or itching.  Neuro: Negative for weakness, abnormal sensation, seizure, frequent headaches, memory loss,  confusion.  Psych: Negative for anxiety, depression, suicidal ideation, hallucinations.  Endo: Negative for unusual weight change.  Heme: Negative for bruising or bleeding. Allergy: Negative for rash or hives.  Physical Exam   BP 127/76 (BP Location: Right Arm, Patient Position: Sitting, Cuff Size: Large)   Pulse 67   Temp 97.9 F (36.6 C) (Oral)   Ht 5\' 2"  (1.575 m)   Wt 210 lb (95.3 kg)   SpO2 97%   BMI 38.41 kg/m    General: Well-nourished, well-developed in no acute distress.  Head: Normocephalic, atraumatic.   Eyes: Conjunctiva pink, no icterus. Mouth: Oropharyngeal mucosa moist and pink  Neck: Supple without thyromegaly, masses, or lymphadenopathy.  Lungs: Clear to auscultation bilaterally.  Heart: Regular rate and rhythm, no murmurs rubs or gallops.  Abdomen: Bowel sounds are normal,  nondistended, no hepatosplenomegaly or masses,  no abdominal bruits or hernia, no rebound or guarding.  Diffuse upper abdominal tenderness, worse in epigastric region. Mild lower abdominal tenderness Rectal: not performed Extremities: No lower extremity edema. No clubbing or deformities.  Neuro: Alert and oriented x 4 , grossly normal neurologically.  Skin: Warm and dry, no rash or jaundice.   Psych: Alert and cooperative, normal mood and affect.  Labs   Lab Results  Component Value Date   NA 141 10/14/2023   CL  106 10/14/2023   K 4.1 10/14/2023   CO2 25 10/14/2023   BUN 16 10/14/2023   CREATININE 0.48 10/14/2023   GFRNONAA >60 10/14/2023   CALCIUM 9.7 10/14/2023   ALBUMIN 4.0 10/14/2023   GLUCOSE 79 10/14/2023   Lab Results  Component Value Date   ALT 32 10/14/2023   AST 25 10/14/2023   ALKPHOS 82 10/14/2023   BILITOT 0.4 10/14/2023   Lab Results  Component Value Date   LIPASE 36 10/14/2023   Lab Results  Component Value Date   WBC 6.4 10/14/2023   HGB 11.9 (L) 10/14/2023   HCT 35.0 (L) 10/14/2023   MCV 87.9 10/14/2023   PLT 308 10/14/2023   07/2023: Hgb 12.7  Imaging Studies   No results found.  Assessment   Epigastric pain/dysphagia/nausea: -h/o H.pylori treated in 2023 by PCP -  consider gastritis/PUD, biliary not excluded at this time -continue pantoprazole  every morning and famotidine  in the evenings -EGD/ED in near future. ASA 2.  I have discussed the risks, alternatives, benefits with regards to but not limited to the risk of reaction to medication, bleeding, infection, perforation and the patient is agreeable to proceed. Written consent to be obtained. -check for persistent H.pylori at time of EGD  Hepatic cysts: -simple small cysts noted on recent CT, not likely source of her pain. No further work up needed.  Mild anemia: -Hgb 11.9 last month, had been 12.7 in 07/2023. May not be significant. Denies melena, brbpr.  -EGD as planned   Colon cancer screening: discussed today -consider in near future, either colonoscopy vs Cologuard, patient agreeable if she has financial assistance.      Trudie Fuse. Harles Lied, MHS, PA-C [facility]

## 2023-11-18 NOTE — Progress Notes (Signed)
 GI Office Note    Referring Provider: Bolivar Bushman., PA-C Primary Care Physician:  Bolivar Bushman., PA-C  Primary Gastroenterologist: Rolando Cliche. Mordechai April, DO   Chief Complaint   Chief Complaint  Patient presents with   Abdominal Pain    Was seen recently in the ED     History of Present Illness   Kimberly Hicks is a 58 y.o. female presenting today at the request of Mercedes Stake, PA-C [facility] health department for generalized abdominal pain, GERD, nausea, hepatic cyst, history of H. pylori.  Age 58, while in [place], had gastritis. H. pylori infection September 2023, treated by PCP.  Several months of epigastric pain but progressively getting worse. Associated with nausea. Lately pain also in RUQ and goes down to RLQ and across lower abdomen. Definitely worse in epigastric region. No heartburn. She has some solid food dysphagia, has to wash food down with water. She had cut back on dairy and acidic foods without relief. In wake up with abdominal pain, she notes eating can help pain. But if she is having no pain, then eating can bring pain on. No specific food triggers. Chronic constipation but little better lately. Lately BM 1-2 times per day.No rectal bleeding. No melena. Notes Pepcid  and pantoprazole  helps some.  CT abdomen pelvis with contrast October 14, 2023: IMPRESSION: 1. No acute or active process within the abdomen or pelvis. 2. Hepatic cysts. 3. Aortic atherosclerosis.  Colonoscopy around 2003 in [place].  Medications   Current Outpatient Medications  Medication Sig Dispense Refill   cetirizine (ZYRTEC) 10 MG tablet Take 1 tablet by mouth daily.     Cholecalciferol (VITAMIN D-3) 125 MCG (5000 UT) TABS Take 1 tablet by mouth daily.     famotidine  (PEPCID ) 20 MG tablet Take 20 mg by mouth at bedtime.     fenofibrate (TRICOR) 145 MG tablet Take 145 mg by mouth daily.     fluticasone (FLONASE) 50 MCG/ACT nasal spray Place into both nostrils daily.      levothyroxine (SYNTHROID) 88 MCG tablet Take 88 mcg by mouth daily before breakfast.     ondansetron  (ZOFRAN ) 4 MG tablet Take 4 mg by mouth every 8 (eight) hours as needed.     pantoprazole  (PROTONIX ) 40 MG tablet Take 1 tablet (40 mg total) by mouth daily. 30 tablet 0   No current facility-administered medications for this visit.    Allergies   Allergies as of 11/18/2023 - Review Complete 11/18/2023  Allergen Reaction Noted   Polymyxin b   08/12/2013   Polytrim  [polymyxin b -trimethoprim ]  08/12/2013   Ciprofloxacin  Rash 09/06/2014    Past Medical History   Past Medical History:  Diagnosis Date   Hyperlipemia    Hypothyroidism    Seasonal allergies     Past Surgical History   Past Surgical History:  Procedure Laterality Date   CESAREAN SECTION     twice   TUMOR EXCISION      Past Family History   Family History  Problem Relation Age of Onset   Pulmonary embolism Mother    Alcohol abuse Sister    Colon cancer Neg Hx    Gastric cancer Neg Hx     Past Social History   Social History   Socioeconomic History   Marital status: Married    Spouse name: Not on file   Number of children: Not on file   Years of education: Not on file   Highest education level: Not on file  Occupational History   Not on file  Tobacco Use   Smoking status: Never   Smokeless tobacco: Never  Vaping Use   Vaping status: Never Used  Substance and Sexual Activity   Alcohol use: No   Drug use: No   Sexual activity: Not on file  Other Topics Concern   Not on file  Social History Narrative   Not on file   Social Drivers of Health   Financial Resource Strain: Not on file  Food Insecurity: Not on file  Transportation Needs: Not on file  Physical Activity: Not on file  Stress: Not on file  Social Connections: Not on file  Intimate Partner Violence: Not on file    Review of Systems   General: Negative for anorexia, weight loss, fever, chills, fatigue, weakness. Eyes:  Negative for vision changes.  ENT: Negative for hoarseness,  nasal congestion. See hpi CV: Negative for chest pain, angina, palpitations, dyspnea on exertion, peripheral edema.  Respiratory: Negative for dyspnea at rest, dyspnea on exertion, cough, sputum, wheezing.  GI: See history of present illness. GU:  Negative for dysuria, hematuria, urinary incontinence, urinary frequency, nocturnal urination.  MS: Negative for joint pain, low back pain.  Derm: Negative for rash or itching.  Neuro: Negative for weakness, abnormal sensation, seizure, frequent headaches, memory loss,  confusion.  Psych: Negative for anxiety, depression, suicidal ideation, hallucinations.  Endo: Negative for unusual weight change.  Heme: Negative for bruising or bleeding. Allergy: Negative for rash or hives.  Physical Exam   BP 127/76 (BP Location: Right Arm, Patient Position: Sitting, Cuff Size: Large)   Pulse 67   Temp 97.9 F (36.6 C) (Oral)   Ht 5\' 2"  (1.575 m)   Wt 210 lb (95.3 kg)   SpO2 97%   BMI 38.41 kg/m    General: Well-nourished, well-developed in no acute distress.  Head: Normocephalic, atraumatic.   Eyes: Conjunctiva pink, no icterus. Mouth: Oropharyngeal mucosa moist and pink  Neck: Supple without thyromegaly, masses, or lymphadenopathy.  Lungs: Clear to auscultation bilaterally.  Heart: Regular rate and rhythm, no murmurs rubs or gallops.  Abdomen: Bowel sounds are normal,  nondistended, no hepatosplenomegaly or masses,  no abdominal bruits or hernia, no rebound or guarding.  Diffuse upper abdominal tenderness, worse in epigastric region. Mild lower abdominal tenderness Rectal: not performed Extremities: No lower extremity edema. No clubbing or deformities.  Neuro: Alert and oriented x 4 , grossly normal neurologically.  Skin: Warm and dry, no rash or jaundice.   Psych: Alert and cooperative, normal mood and affect.  Labs   Lab Results  Component Value Date   NA 141 10/14/2023   CL  106 10/14/2023   K 4.1 10/14/2023   CO2 25 10/14/2023   BUN 16 10/14/2023   CREATININE 0.48 10/14/2023   GFRNONAA >60 10/14/2023   CALCIUM 9.7 10/14/2023   ALBUMIN 4.0 10/14/2023   GLUCOSE 79 10/14/2023   Lab Results  Component Value Date   ALT 32 10/14/2023   AST 25 10/14/2023   ALKPHOS 82 10/14/2023   BILITOT 0.4 10/14/2023   Lab Results  Component Value Date   LIPASE 36 10/14/2023   Lab Results  Component Value Date   WBC 6.4 10/14/2023   HGB 11.9 (L) 10/14/2023   HCT 35.0 (L) 10/14/2023   MCV 87.9 10/14/2023   PLT 308 10/14/2023   07/2023: Hgb 12.7  Imaging Studies   No results found.  Assessment   Epigastric pain/dysphagia/nausea: -h/o H.pylori treated in 2023 by PCP -  consider gastritis/PUD, biliary not excluded at this time -continue pantoprazole  every morning and famotidine  in the evenings -EGD/ED in near future. ASA 2.  I have discussed the risks, alternatives, benefits with regards to but not limited to the risk of reaction to medication, bleeding, infection, perforation and the patient is agreeable to proceed. Written consent to be obtained. -check for persistent H.pylori at time of EGD  Hepatic cysts: -simple small cysts noted on recent CT, not likely source of her pain. No further work up needed.  Mild anemia: -Hgb 11.9 last month, had been 12.7 in 07/2023. May not be significant. Denies melena, brbpr.  -EGD as planned   Colon cancer screening: discussed today -consider in near future, either colonoscopy vs Cologuard, patient agreeable if she has financial assistance.      Trudie Fuse. Harles Lied, MHS, PA-C [facility]

## 2023-11-18 NOTE — Patient Instructions (Addendum)
 Continue pantoprazole  40mg  daily before breakfast. Continue famotidine  20mg  at bedtime. Upper endoscopy to be scheduled.  Once we have complete work upf or your abdominal pain, we would recommend updating your colonoscopy.  Contine con pantoprazol 40 mg al da antes del desayuno.  Contine con famotidina 20 mg antes de acostarse.  Se programar una endoscopia digestiva alta.  Una vez que hayamos evaluado su dolor abdominal, le recomendamos que realice una nueva colonoscopia.

## 2023-11-25 ENCOUNTER — Ambulatory Visit (HOSPITAL_COMMUNITY): Payer: Self-pay | Admitting: Anesthesiology

## 2023-11-25 ENCOUNTER — Ambulatory Visit (HOSPITAL_COMMUNITY)
Admission: RE | Admit: 2023-11-25 | Discharge: 2023-11-25 | Disposition: A | Discharge status: Home / Self Care | Payer: Self-pay | Attending: Internal Medicine | Admitting: Internal Medicine

## 2023-11-25 ENCOUNTER — Encounter (HOSPITAL_COMMUNITY): Payer: Self-pay | Admitting: Internal Medicine

## 2023-11-25 ENCOUNTER — Encounter (HOSPITAL_COMMUNITY)
Admission: RE | Disposition: A | Discharge status: Home / Self Care | Payer: Self-pay | Source: Home / Self Care | Attending: Internal Medicine

## 2023-11-25 ENCOUNTER — Other Ambulatory Visit: Payer: Self-pay

## 2023-11-25 DIAGNOSIS — R1013 Epigastric pain: Secondary | ICD-10-CM | POA: Insufficient documentation

## 2023-11-25 DIAGNOSIS — Z79899 Other long term (current) drug therapy: Secondary | ICD-10-CM | POA: Insufficient documentation

## 2023-11-25 DIAGNOSIS — R1310 Dysphagia, unspecified: Secondary | ICD-10-CM | POA: Insufficient documentation

## 2023-11-25 DIAGNOSIS — E039 Hypothyroidism, unspecified: Secondary | ICD-10-CM | POA: Insufficient documentation

## 2023-11-25 DIAGNOSIS — K2950 Unspecified chronic gastritis without bleeding: Secondary | ICD-10-CM | POA: Insufficient documentation

## 2023-11-25 DIAGNOSIS — K2970 Gastritis, unspecified, without bleeding: Secondary | ICD-10-CM

## 2023-11-25 DIAGNOSIS — I700 Atherosclerosis of aorta: Secondary | ICD-10-CM | POA: Insufficient documentation

## 2023-11-25 DIAGNOSIS — Z79890 Hormone replacement therapy (postmenopausal): Secondary | ICD-10-CM | POA: Insufficient documentation

## 2023-11-25 DIAGNOSIS — K219 Gastro-esophageal reflux disease without esophagitis: Secondary | ICD-10-CM | POA: Insufficient documentation

## 2023-11-25 DIAGNOSIS — K5909 Other constipation: Secondary | ICD-10-CM | POA: Insufficient documentation

## 2023-11-25 DIAGNOSIS — D649 Anemia, unspecified: Secondary | ICD-10-CM | POA: Insufficient documentation

## 2023-11-25 DIAGNOSIS — K7689 Other specified diseases of liver: Secondary | ICD-10-CM | POA: Insufficient documentation

## 2023-11-25 HISTORY — PX: ESOPHAGOGASTRODUODENOSCOPY: SHX5428

## 2023-11-25 SURGERY — EGD (ESOPHAGOGASTRODUODENOSCOPY)
Anesthesia: General

## 2023-11-25 MED ORDER — PANTOPRAZOLE SODIUM 40 MG PO TBEC
40.0000 mg | DELAYED_RELEASE_TABLET | Freq: Two times a day (BID) | ORAL | 11 refills | Status: AC
Start: 2023-11-25 — End: 2024-11-24

## 2023-11-25 MED ORDER — GLYCOPYRROLATE PF 0.2 MG/ML IJ SOSY
PREFILLED_SYRINGE | INTRAMUSCULAR | Status: DC | PRN
  Administered 2023-11-25: .1 mg via INTRAVENOUS

## 2023-11-25 MED ORDER — PROPOFOL 10 MG/ML IV BOLUS
INTRAVENOUS | Status: DC | PRN
  Administered 2023-11-25: 70 mg via INTRAVENOUS

## 2023-11-25 MED ORDER — LIDOCAINE 2% (20 MG/ML) 5 ML SYRINGE
INTRAMUSCULAR | Status: AC
  Filled 2023-11-25: qty 5

## 2023-11-25 MED ORDER — PROPOFOL 500 MG/50ML IV EMUL
INTRAVENOUS | Status: DC | PRN
  Administered 2023-11-25: 150 ug/kg/min via INTRAVENOUS

## 2023-11-25 MED ORDER — LACTATED RINGERS IV SOLN
INTRAVENOUS | Status: DC | PRN
Start: 2023-11-25 — End: 2023-11-25

## 2023-11-25 MED ORDER — PROPOFOL 1000 MG/100ML IV EMUL
INTRAVENOUS | Status: AC
  Filled 2023-11-25: qty 100

## 2023-11-25 MED ORDER — PROPOFOL 500 MG/50ML IV EMUL
INTRAVENOUS | Status: AC
  Filled 2023-11-25: qty 50

## 2023-11-25 MED ORDER — LIDOCAINE 2% (20 MG/ML) 5 ML SYRINGE
INTRAMUSCULAR | Status: DC | PRN
  Administered 2023-11-25: 60 mg via INTRAVENOUS

## 2023-11-25 NOTE — Transfer of Care (Addendum)
 Immediate Anesthesia Transfer of Care Note  Patient: Kimberly Hicks  Procedure(s) Performed: EGD (ESOPHAGOGASTRODUODENOSCOPY)  Patient Location: Endoscopy Unit  Anesthesia Type:General  Level of Consciousness: drowsy and patient cooperative  Airway & Oxygen Therapy: Patient Spontanous Breathing  Post-op Assessment: Report given to RN and Post -op Vital signs reviewed and stable  Post vital signs: Reviewed and stable  Last Vitals:  Vitals Value Taken Time  BP 136/57 11/25/23   1137  Temp 36.9 11/25/23   1137  Pulse 105 11/25/23   1137  Resp 19 11/25/23   1137  SpO2 96% 11/25/23   1137    Last Pain:  Vitals:   11/25/23 1121  TempSrc:   PainSc: 0-No pain      Patients Stated Pain Goal: 4 (11/25/23 1022)  Complications: No notable events documented.

## 2023-11-25 NOTE — Discharge Instructions (Addendum)
 Su EGD revel una leve inflamacin en su estmago. Le realic biopsias para descartar una infeccin por H. pylori. Espere los resultados de Agricultural consultant; mi consultorio se pondr en contacto con usted. Su esfago y su intestino delgado estaban normales.  Aumentar su dosis de pantoprazol a [employer]. Envi esto a su farmacia.  Su cita de seguimiento en la consulta de gastroenterologa ser en 4 a 6 semanas.  Fue un Printmaker.  Dr. Mordechai April

## 2023-11-25 NOTE — Anesthesia Postprocedure Evaluation (Signed)
 Anesthesia Post Note  Patient: GEORDYN STUM  Procedure(s) Performed: EGD (ESOPHAGOGASTRODUODENOSCOPY)  Patient location during evaluation: PACU Anesthesia Type: General Level of consciousness: awake and alert Pain management: pain level controlled Vital Signs Assessment: post-procedure vital signs reviewed and stable Respiratory status: spontaneous breathing, nonlabored ventilation, respiratory function stable and patient connected to nasal cannula oxygen Cardiovascular status: blood pressure returned to baseline and stable Postop Assessment: no apparent nausea or vomiting Anesthetic complications: no   There were no known notable events for this encounter.   Last Vitals:  Vitals:   11/25/23 1036 11/25/23 1137  BP: 134/67 (!) 136/57  Pulse: 74 (!) 105  Resp: 20 19  Temp: 36.9 C 36.9 C  SpO2: 98% 96%    Last Pain:  Vitals:   11/25/23 1137  TempSrc: Oral  PainSc: 0-No pain                 Tamarcus Condie L Drisana Schweickert

## 2023-11-25 NOTE — Anesthesia Preprocedure Evaluation (Addendum)
 Anesthesia Evaluation  Patient identified by MRN, date of birth, ID band Patient awake    Reviewed: Allergy & Precautions, H&P , NPO status , Patient's Chart, lab work & pertinent test results, reviewed documented beta blocker date and time   Airway Mallampati: II  TM Distance: >3 FB Neck ROM: full    Dental no notable dental hx. (+) Dental Advisory Given, Teeth Intact   Pulmonary neg pulmonary ROS   Pulmonary exam normal breath sounds clear to auscultation       Cardiovascular Exercise Tolerance: Good negative cardio ROS Normal cardiovascular exam Rhythm:regular Rate:Normal     Neuro/Psych negative neurological ROS  negative psych ROS   GI/Hepatic negative GI ROS, Neg liver ROS,,,  Endo/Other  Hypothyroidism    Renal/GU negative Renal ROS  negative genitourinary   Musculoskeletal   Abdominal   Peds  Hematology negative hematology ROS (+)   Anesthesia Other Findings   Reproductive/Obstetrics negative OB ROS                             Anesthesia Physical Anesthesia Plan  ASA: 2  Anesthesia Plan: General   Post-op Pain Management: Minimal or no pain anticipated   Induction: Intravenous  PONV Risk Score and Plan: Propofol infusion  Airway Management Planned: Nasal Cannula and Natural Airway  Additional Equipment: None  Intra-op Plan:   Post-operative Plan:   Informed Consent: I have reviewed the patients History and Physical, chart, labs and discussed the procedure including the risks, benefits and alternatives for the proposed anesthesia with the patient or authorized representative who has indicated his/her understanding and acceptance.     Dental Advisory Given and Interpreter used for interview  Plan Discussed with: CRNA  Anesthesia Plan Comments:         Anesthesia Quick Evaluation

## 2023-11-25 NOTE — Op Note (Signed)
 [facility] Patient Name: Kimberly Hicks Procedure Date: 11/25/2023 11:01 AM MRN: [id number] Date of Birth: Jan 22, 1966 Attending MD: Rolando Cliche. Mordechai April , [place], [id number] CSN: [id number] Age: 58 Admit Type: Outpatient Procedure:                Upper GI endoscopy Indications:              Epigastric abdominal pain, Heartburn Providers:                Rolando Cliche. Mordechai April, DO, Emilee Tubb RN, RN, Theola Fitch, [place] Wilson, Technician Referring MD:             Rolando Cliche. Mordechai April, DO Medicines:                See the Anesthesia note for documentation of the                            administered medications Complications:            No immediate complications. Estimated Blood Loss:     Estimated blood loss was minimal. Procedure:                Pre-Anesthesia Assessment:                           - The anesthesia plan was to use monitored                            anesthesia care (MAC).                           After obtaining informed consent, the endoscope was                            passed under direct vision. Throughout the                            procedure, the patient's blood pressure, pulse, and                            oxygen saturations were monitored continuously. The                            [id number]-H190 ([id number]) scope was introduced through the                            mouth, and advanced to the second part of duodenum.                            The upper GI endoscopy was accomplished without                            difficulty. The patient tolerated the procedure  well. Scope In: 11:25:53 AM Scope Out: 11:30:06 AM Total Procedure Duration: 0 hours 4 minutes 13 seconds  Findings:      The Z-line was regular and was found 39 cm from the incisors.      There is no endoscopic evidence of stenosis or stricture in the entire       esophagus.      Patchy mild inflammation characterized by erythema was found in the        gastric body and in the gastric antrum. Biopsies were taken with a cold       forceps for Helicobacter pylori testing.      The duodenal bulb, first portion of the duodenum and second portion of       the duodenum were normal. Impression:               - Z-line regular, 39 cm from the incisors.                           - Gastritis. Biopsied.                           - Normal duodenal bulb, first portion of the                            duodenum and second portion of the duodenum. Moderate Sedation:      Per Anesthesia Care Recommendation:           - Patient has a contact number available for                            emergencies. The signs and symptoms of potential                            delayed complications were discussed with the                            patient. Return to normal activities tomorrow.                            Written discharge instructions were provided to the                            patient.                           - Resume previous diet.                           - Continue present medications.                           - Await pathology results.                           - Use Protonix  (pantoprazole ) 40 mg PO BID for 12                            weeks.                           -  Return to GI clinic in 6 weeks. Procedure Code(s):        --- Professional ---                           [phone], Esophagogastroduodenoscopy, flexible,                            transoral; with biopsy, single or multiple Diagnosis Code(s):        --- Professional ---                           K29.70, Gastritis, unspecified, without bleeding                           R10.13, Epigastric pain                           R12, Heartburn CPT copyright 2022 American Medical Association. All rights reserved. The codes documented in this report are preliminary and upon coder review may  be revised to meet current compliance requirements. Rolando Cliche. Mordechai April, DO Rolando Cliche.  Mordechai April, DO 11/25/2023 11:33:36 AM This report has been signed electronically. Number of Addenda: 0

## 2023-11-25 NOTE — Interval H&P Note (Signed)
 History and Physical Interval Note:  11/25/2023 11:06 AM  Kimberly Hicks  has presented today for surgery, with the diagnosis of dysphagia,RUQ/epigastric pain.  The various methods of treatment have been discussed with the patient and family. After consideration of risks, benefits and other options for treatment, the patient has consented to  Procedure(s) with comments: EGD (ESOPHAGOGASTRODUODENOSCOPY) (N/A) - 11:30 am, asa 2 DILATION, ESOPHAGUS (N/A) as a surgical intervention.  The patient's history has been reviewed, patient examined, no change in status, stable for surgery.  I have reviewed the patient's chart and labs.  Questions were answered to the patient's satisfaction.     [name]

## 2023-11-26 ENCOUNTER — Encounter (HOSPITAL_COMMUNITY): Payer: Self-pay | Admitting: Internal Medicine

## 2023-11-26 LAB — SURGICAL PATHOLOGY

## 2024-01-06 ENCOUNTER — Ambulatory Visit (INDEPENDENT_AMBULATORY_CARE_PROVIDER_SITE_OTHER): Payer: Self-pay | Admitting: Gastroenterology

## 2024-01-06 ENCOUNTER — Encounter: Payer: Self-pay | Admitting: Gastroenterology

## 2024-01-06 ENCOUNTER — Encounter: Payer: Self-pay | Admitting: *Deleted

## 2024-01-06 VITALS — BP 113/71 | HR 62 | Temp 98.5°F | Ht 62.0 in | Wt 210.2 lb

## 2024-01-06 DIAGNOSIS — D649 Anemia, unspecified: Secondary | ICD-10-CM

## 2024-01-06 DIAGNOSIS — K2970 Gastritis, unspecified, without bleeding: Secondary | ICD-10-CM | POA: Insufficient documentation

## 2024-01-06 DIAGNOSIS — R1011 Right upper quadrant pain: Secondary | ICD-10-CM

## 2024-01-06 DIAGNOSIS — K625 Hemorrhage of anus and rectum: Secondary | ICD-10-CM | POA: Insufficient documentation

## 2024-01-06 DIAGNOSIS — K2960 Other gastritis without bleeding: Secondary | ICD-10-CM

## 2024-01-06 DIAGNOSIS — R1310 Dysphagia, unspecified: Secondary | ICD-10-CM

## 2024-01-06 DIAGNOSIS — K5900 Constipation, unspecified: Secondary | ICD-10-CM | POA: Insufficient documentation

## 2024-01-06 MED ORDER — POLYETHYLENE GLYCOL 3350 17 GM/SCOOP PO POWD
ORAL | 0 refills | Status: AC
Start: 2024-01-06 — End: ?

## 2024-01-06 NOTE — Patient Instructions (Addendum)
 For gastritis: continue pantoprazole  40mg  before breakfast and 40mg  before supper until 02/27/24. At that time, you can decrease pantoprazole  to 40mg  before breakfast. If you notice recurrent upper abdominal pain/burning or heartburn, then go back to twice a day dosing.  For constipation: start miralax or store brand, take one capful mixed in 6 ounces of liquid such as water/coffee/juice twice daily until stools are soft. Then continue one capful once daily to maintain soft stools.   For right sided abdominal pain: ultrasound of your gallbladder to be scheduled.  For blood with wiping: we will move forward with a colonoscopy after your ultrasound is complete.   Return office visit in six months.     Para gastritis: contine con pantoprazol 40 mg antes del desayuno y 40 mg antes de la cena hasta el 08/06/23. En ese momento, puede reducir la dosis a 40 mg antes del desayuno. Si nota dolor/ardor recurrente en el abdomen superior o acidez estomacal, vuelva a la dosis [employer].  Para estreimiento: comience con Miralax o la marca blanca; tome una tapa diluida en 170 ml de lquido, como agua, caf o [place], [employer] veces al da [employer] las heces estn blandas. Luego, contine con una tapa una vez al da para mantener las heces blandas.  Para dolor abdominal en el lado derecho: se programar una ecografa de la vescula biliar.  Para sangre al limpiarse: procederemos a una colonoscopia despus de que se complete la ecografa.  Regrese a Medical laboratory scientific officer.

## 2024-01-06 NOTE — Progress Notes (Signed)
 GI Office Note    Referring Provider: Bolivar Bushman., PA-C Primary Care Physician:  Bolivar Bushman., PA-C  Primary Gastroenterologist: Rolando Cliche. Mordechai April, DO   Chief Complaint   Chief Complaint  Patient presents with   Follow-up    Follow up on EGD and abd pain. Pt states this is all better    History of Present Illness   Kimberly Hicks is a 58 y.o. female presenting today for follow up. Last seen 10/2023 for upper abdominal pain, GERD, dysphagia, nausea, hepatic cyst, mild anemia, h/o H.pylori, treated 03/2022. Presents with formal Spanish interpretor.   At time of last office visit she complained of several month history of progressively worsening epigastric pain associated with nausea.  Also had developed right upper quadrant pain radiating down into the right lower abdomen.  She had cut back on dairy and acidic foods.  Had noted sometimes eating would help with the pain.  However sometimes if she is not having pain and then eats this would bring it on.  She has not been able to notice any specific food triggers.  Some improvement with Pepcid  and pantoprazole .  Also complaining of solid food dysphagia.    Completed EGD back in April noted to have mild chronic gastritis but no H. pylori.  Pantoprazole  was increased twice daily at time of endoscopy, instructions to continue for total of 3 months.  Today: Abdominal pain improved. No longer having epigastric pain/burning. Swallowing issues if eats too fast or very dry food. No heartburn. Continues to have some right sided abdominal pain. Last episode one week ago. Occurs about 2 hours after a meal. No N/V. Also has more frequent soreness in right abdomen to touch. For most part BM regular daily but stools are hard. She has skipped couple of days and then feels like she tears with BM. Will see brbpr on toilet tissue at that time. Tried lactulose one tbsp daily and did not help much. Gave her heartburn.   CT abdomen pelvis with  contrast October 14, 2023: IMPRESSION: 1. No acute or active process within the abdomen or pelvis. 2. Hepatic cysts. 3. Aortic atherosclerosis.   EGD 10/2023:  -Z-line regular -gastritis s/p bx, mild chronic gastritis, no H.pylori -normal duodenal bulb, first portion of duodenum and second portion of duodenum  Colonoscopy around 2003 in [place].  Medications   Current Outpatient Medications  Medication Sig Dispense Refill   cetirizine (ZYRTEC) 10 MG tablet Take 1 tablet by mouth daily.     Cholecalciferol (VITAMIN D-3) 125 MCG (5000 UT) TABS Take 1 tablet by mouth daily.     famotidine  (PEPCID ) 20 MG tablet Take 20 mg by mouth at bedtime.     fenofibrate (TRICOR) 145 MG tablet Take 145 mg by mouth daily.     fluticasone (FLONASE) 50 MCG/ACT nasal spray Place into both nostrils daily.     levothyroxine (SYNTHROID) 88 MCG tablet Take 88 mcg by mouth daily before breakfast.     ondansetron  (ZOFRAN ) 4 MG tablet Take 4 mg by mouth every 8 (eight) hours as needed.     pantoprazole  (PROTONIX ) 40 MG tablet Take 1 tablet (40 mg total) by mouth 2 (two) times daily. 60 tablet 11   No current facility-administered medications for this visit.    Allergies   Allergies as of 01/06/2024 - Review Complete 01/06/2024  Allergen Reaction Noted   Polymyxin b   08/12/2013   Polytrim  [polymyxin b -trimethoprim ]  08/12/2013   Ciprofloxacin  Rash 09/06/2014  Review of Systems   General: Negative for anorexia, weight loss, fever, chills, fatigue, weakness. ENT: Negative for hoarseness, difficulty swallowing , nasal congestion. CV: Negative for chest pain, angina, palpitations, dyspnea on exertion, peripheral edema.  Respiratory: Negative for dyspnea at rest, dyspnea on exertion, cough, sputum, wheezing.  GI: See history of present illness. GU:  Negative for dysuria, hematuria, urinary incontinence, urinary frequency, nocturnal urination.  Endo: Negative for unusual weight change.      Physical Exam   BP 113/71   Pulse 62   Temp 98.5 F (36.9 C)   Ht 5\' 2"  (1.575 m)   Wt 210 lb 3.2 oz (95.3 kg)   BMI 38.45 kg/m    General: Well-nourished, well-developed in no acute distress.  Eyes: No icterus. Mouth: Oropharyngeal mucosa moist and pink   Lungs: Clear to auscultation bilaterally.  Heart: Regular rate and rhythm, no murmurs rubs or gallops.  Abdomen: Bowel sounds are normal, nondistended, no hepatosplenomegaly or masses, no abdominal bruits or hernia , no rebound or guarding. Mild right sided tenderness entire right side. No rib tenderness Rectal: not performed Extremities: No lower extremity edema. No clubbing or deformities. Neuro: Alert and oriented x 4   Skin: Warm and dry, no jaundice.   Psych: Alert and cooperative, normal mood and affect.  Labs   Lab Results  Component Value Date   NA 141 10/14/2023   CL 106 10/14/2023   K 4.1 10/14/2023   CO2 25 10/14/2023   BUN 16 10/14/2023   CREATININE 0.48 10/14/2023   GFRNONAA >60 10/14/2023   CALCIUM 9.7 10/14/2023   ALBUMIN 4.0 10/14/2023   GLUCOSE 79 10/14/2023   Lab Results  Component Value Date   ALT 32 10/14/2023   AST 25 10/14/2023   ALKPHOS 82 10/14/2023   BILITOT 0.4 10/14/2023   Lab Results  Component Value Date   LIPASE 36 10/14/2023   Lab Results  Component Value Date   WBC 6.4 10/14/2023   HGB 11.9 (L) 10/14/2023   HCT 35.0 (L) 10/14/2023   MCV 87.9 10/14/2023   PLT 308 10/14/2023     Imaging Studies   No results found.  Assessment/Plan:   Gastritis: no h.pylori on biopsy, previously treated in 2023 by PCP, doing better -continue pantoprazole  40mg  BID before meals until 02/27/24, then drop back to once daily before breakfast. If recurrent symptoms, she can go back to twice daily.  Dysphagia: esophagus appeared normal on recent EGD. Some ongoing intermittent symptoms especially if she eats fast.  -slow down with eating, use plenty of liquids with dry food  Constipation:  mild -recent brbpr, likely hemorrhoid or tear -start miralax 17g bid until soft stool, then continue daily  Rectal bleeding: -likely benign anorectal source in setting of constipation -no prior colonoscopy -colonoscopy in next month or so, pending work up of ruq pain.  Mild anemia: Hgb 11.9 in March, had been 12.7 in 07/2023.  -recheck in next month or so  Ruq pain: postprandial. Cannot rule out musculoskeletal component contributing as well. -ruq u/s to rule out cholelithiasis     Trudie Fuse. Harles Lied, MHS, PA-C [facility]

## 2024-01-13 ENCOUNTER — Telehealth: Payer: Self-pay

## 2024-01-13 NOTE — Telephone Encounter (Signed)
 Attempted follow up call to [facility] client with interpreter services. No answer today and unable to leave a message.  Next appointment with [facility] is 02/23/24   Will plan to continue to follow and monitor labs and attempt follow up- again later.   Kris Pester RN [employer]

## 2024-01-14 ENCOUNTER — Ambulatory Visit: Payer: Self-pay | Admitting: Gastroenterology

## 2024-01-14 ENCOUNTER — Ambulatory Visit (HOSPITAL_COMMUNITY)
Admission: RE | Admit: 2024-01-14 | Discharge: 2024-01-14 | Disposition: A | Discharge status: Home / Self Care | Payer: Self-pay | Source: Ambulatory Visit | Attending: Gastroenterology | Admitting: Gastroenterology

## 2024-01-14 DIAGNOSIS — K625 Hemorrhage of anus and rectum: Secondary | ICD-10-CM | POA: Insufficient documentation

## 2024-01-14 DIAGNOSIS — K5900 Constipation, unspecified: Secondary | ICD-10-CM | POA: Insufficient documentation

## 2024-01-14 DIAGNOSIS — K2960 Other gastritis without bleeding: Secondary | ICD-10-CM | POA: Insufficient documentation

## 2024-01-14 DIAGNOSIS — R1011 Right upper quadrant pain: Secondary | ICD-10-CM | POA: Insufficient documentation

## 2024-01-20 ENCOUNTER — Other Ambulatory Visit: Payer: Self-pay | Admitting: *Deleted

## 2024-01-20 ENCOUNTER — Other Ambulatory Visit: Payer: Self-pay

## 2024-01-20 ENCOUNTER — Encounter: Payer: Self-pay | Admitting: *Deleted

## 2024-01-20 DIAGNOSIS — K625 Hemorrhage of anus and rectum: Secondary | ICD-10-CM

## 2024-01-20 MED ORDER — PEG 3350-KCL-NA BICARB-NACL 420 G PO SOLR: 4000.0000 mL | Freq: Once | ORAL | 0 refills | Status: AC

## 2024-01-22 ENCOUNTER — Other Ambulatory Visit (HOSPITAL_COMMUNITY)
Admission: RE | Admit: 2024-01-22 | Discharge: 2024-01-22 | Disposition: A | Discharge status: Home / Self Care | Payer: Self-pay | Source: Ambulatory Visit | Attending: Gastroenterology | Admitting: Gastroenterology

## 2024-01-22 DIAGNOSIS — K625 Hemorrhage of anus and rectum: Secondary | ICD-10-CM | POA: Insufficient documentation

## 2024-01-22 LAB — FERRITIN: Ferritin: 17 ng/mL (ref 11–307)

## 2024-01-22 LAB — CBC WITH DIFFERENTIAL/PLATELET
Abs Immature Granulocytes: 0.02 10*3/uL (ref 0.00–0.07)
Basophils Absolute: 0 10*3/uL (ref 0.0–0.1)
Basophils Relative: 0 %
Eosinophils Absolute: 0.1 10*3/uL (ref 0.0–0.5)
Eosinophils Relative: 2 %
HCT: 34.8 % — ABNORMAL LOW (ref 36.0–46.0)
Hemoglobin: 12 g/dL (ref 12.0–15.0)
Immature Granulocytes: 0 %
Lymphocytes Relative: 31 %
Lymphs Abs: 1.7 10*3/uL (ref 0.7–4.0)
MCH: 30.7 pg (ref 26.0–34.0)
MCHC: 34.5 g/dL (ref 30.0–36.0)
MCV: 89 fL (ref 80.0–100.0)
Monocytes Absolute: 0.4 10*3/uL (ref 0.1–1.0)
Monocytes Relative: 8 %
Neutro Abs: 3.2 10*3/uL (ref 1.7–7.7)
Neutrophils Relative %: 59 %
Platelets: 293 10*3/uL (ref 150–400)
RBC: 3.91 MIL/uL (ref 3.87–5.11)
RDW: 13.6 % (ref 11.5–15.5)
WBC: 5.5 10*3/uL (ref 4.0–10.5)
nRBC: 0 % (ref 0.0–0.2)

## 2024-01-22 LAB — IRON AND TIBC
Iron: 116 ug/dL (ref 28–170)
Saturation Ratios: 25 % (ref 10.4–31.8)
TIBC: 464 ug/dL — ABNORMAL HIGH (ref 250–450)
UIBC: 348 ug/dL

## 2024-02-13 ENCOUNTER — Ambulatory Visit: Payer: Self-pay | Admitting: Gastroenterology

## 2024-02-17 ENCOUNTER — Encounter (HOSPITAL_COMMUNITY): Payer: Self-pay | Admitting: Anesthesiology

## 2024-02-17 ENCOUNTER — Ambulatory Visit (HOSPITAL_COMMUNITY)
Admission: RE | Admit: 2024-02-17 | Discharge: 2024-02-17 | Disposition: A | Discharge status: Home / Self Care | Payer: Self-pay | Attending: Internal Medicine | Admitting: Internal Medicine

## 2024-02-17 ENCOUNTER — Encounter (HOSPITAL_COMMUNITY): Payer: Self-pay | Admitting: Internal Medicine

## 2024-02-17 ENCOUNTER — Encounter (HOSPITAL_COMMUNITY)
Admission: RE | Disposition: A | Discharge status: Home / Self Care | Payer: Self-pay | Source: Home / Self Care | Attending: Internal Medicine

## 2024-02-17 ENCOUNTER — Other Ambulatory Visit: Payer: Self-pay

## 2024-02-17 ENCOUNTER — Ambulatory Visit (HOSPITAL_COMMUNITY): Payer: Self-pay | Admitting: Anesthesiology

## 2024-02-17 DIAGNOSIS — E039 Hypothyroidism, unspecified: Secondary | ICD-10-CM

## 2024-02-17 DIAGNOSIS — I10 Essential (primary) hypertension: Secondary | ICD-10-CM

## 2024-02-17 DIAGNOSIS — Z6841 Body Mass Index (BMI) 40.0 and over, adult: Secondary | ICD-10-CM | POA: Insufficient documentation

## 2024-02-17 DIAGNOSIS — K649 Unspecified hemorrhoids: Secondary | ICD-10-CM

## 2024-02-17 DIAGNOSIS — E785 Hyperlipidemia, unspecified: Secondary | ICD-10-CM | POA: Insufficient documentation

## 2024-02-17 DIAGNOSIS — K648 Other hemorrhoids: Secondary | ICD-10-CM | POA: Insufficient documentation

## 2024-02-17 DIAGNOSIS — K529 Noninfective gastroenteritis and colitis, unspecified: Secondary | ICD-10-CM

## 2024-02-17 DIAGNOSIS — Z79890 Hormone replacement therapy (postmenopausal): Secondary | ICD-10-CM | POA: Insufficient documentation

## 2024-02-17 DIAGNOSIS — Q438 Other specified congenital malformations of intestine: Secondary | ICD-10-CM

## 2024-02-17 DIAGNOSIS — K6389 Other specified diseases of intestine: Secondary | ICD-10-CM

## 2024-02-17 DIAGNOSIS — E66813 Obesity, class 3: Secondary | ICD-10-CM | POA: Insufficient documentation

## 2024-02-17 DIAGNOSIS — K625 Hemorrhage of anus and rectum: Secondary | ICD-10-CM | POA: Insufficient documentation

## 2024-02-17 HISTORY — PX: COLONOSCOPY: SHX5424

## 2024-02-17 SURGERY — COLONOSCOPY
Anesthesia: General

## 2024-02-17 MED ORDER — LACTATED RINGERS IV SOLN: INTRAVENOUS | Status: DC

## 2024-02-17 MED ORDER — PROPOFOL 500 MG/50ML IV EMUL
INTRAVENOUS | Status: DC | PRN
Start: 2024-02-17 — End: 2024-02-17
  Administered 2024-02-17: 100 ug/kg/min via INTRAVENOUS

## 2024-02-17 MED ORDER — PHENYLEPHRINE 80 MCG/ML (10ML) SYRINGE FOR IV PUSH (FOR BLOOD PRESSURE SUPPORT)
PREFILLED_SYRINGE | INTRAVENOUS | Status: DC | PRN
  Administered 2024-02-17 (×4): 80 ug via INTRAVENOUS

## 2024-02-17 MED ORDER — LIDOCAINE 2% (20 MG/ML) 5 ML SYRINGE
INTRAMUSCULAR | Status: DC | PRN
  Administered 2024-02-17: 80 mg via INTRAVENOUS

## 2024-02-17 NOTE — Transfer of Care (Signed)
 Immediate Anesthesia Transfer of Care Note  Patient: Kimberly Hicks  Procedure(s) Performed: COLONOSCOPY  Patient Location: Endoscopy Unit  Anesthesia Type:General  Level of Consciousness: awake and patient cooperative  Airway & Oxygen Therapy: Patient Spontanous Breathing  Post-op Assessment: Report given to RN and Post -op Vital signs reviewed and stable  Post vital signs: Reviewed and stable  Last Vitals:  Vitals Value Taken Time  BP 119/66 02/17/24 10:32  Temp 36.5 C 02/17/24 10:32  Pulse 58 02/17/24 10:32  Resp 24 02/17/24 10:32  SpO2 100 % 02/17/24 10:32    Last Pain:  Vitals:   02/17/24 1032  TempSrc: Oral  PainSc: 0-No pain         Complications: No notable events documented.

## 2024-02-17 NOTE — Anesthesia Procedure Notes (Signed)
 Date/Time: 02/17/2024 9:53 AM  Performed by: Barbarann Verneita RAMAN, CRNAPre-anesthesia Checklist: Patient identified, Emergency Drugs available, Suction available, Timeout performed and Patient being monitored Patient Re-evaluated:Patient Re-evaluated prior to induction Oxygen Delivery Method: Nasal Cannula

## 2024-02-17 NOTE — Op Note (Signed)
 [facility] Patient Name: Kimberly Hicks Procedure Date: 02/17/2024 9:44 AM MRN: [id number] Date of Birth: October 21, 1965 Attending MD: Carlin POUR. Cindie , [place], [id number] CSN: [id number] Age: 58 Admit Type: Outpatient Procedure:                Colonoscopy Indications:              Rectal bleeding Providers:                Carlin POUR. Cindie, DO, Harlene Lips, Daphne Mulch Technician, Technician Referring MD:             Carlin POUR. Cindie, DO Medicines:                See the Anesthesia note for documentation of the                            administered medications Complications:            No immediate complications. Estimated Blood Loss:     Estimated blood loss was minimal. Procedure:                Pre-Anesthesia Assessment:                           - The anesthesia plan was to use monitored                            anesthesia care (MAC).                           After obtaining informed consent, the colonoscope                            was passed under direct vision. Throughout the                            procedure, the patient's blood pressure, pulse, and                            oxygen saturations were monitored continuously. The                            [id number]-HQ190L ([id number]) scope was introduced through                            the anus and advanced to the the terminal ileum,                            with identification of the appendiceal orifice and                            IC valve. The colonoscopy was technically difficult                            and complex due to  a redundant colon and                            significant looping. Successful completion of the                            procedure was aided by changing the patient to a                            supine position and applying abdominal pressure.                            The patient tolerated the procedure well. The                            quality of  the bowel preparation was evaluated                            using the BBPS [facility] Bowel Preparation Scale)                            with scores of: Right Colon = 3, Transverse Colon =                            3 and Left Colon = 3 (entire mucosa seen well with                            no residual staining, small fragments of stool or                            opaque liquid). The total BBPS score equals 9. Scope In: 9:58:52 AM Scope Out: 10:24:16 AM Scope Withdrawal Time: 0 hours 22 minutes 2 seconds  Total Procedure Duration: 0 hours 25 minutes 24 seconds  Findings:      Non-bleeding internal hemorrhoids were found during retroflexion.      Scattered mild inflammation characterized by erosions and erythema was       found in the rectum and in the sigmoid colon. Biopsies were taken with a       cold forceps for histology.      Segmental biopsies were taken with a cold forceps in the entire colon       for histology.      The terminal ileum appeared normal. Biopsies were taken with a cold       forceps for histology. Impression:               - Non-bleeding internal hemorrhoids.                           - Scattered mild inflammation was found in the                            rectum and in the sigmoid colon secondary to  colitis. Biopsied.                           - The examined portion of the ileum was normal.                            Biopsied.                           - Biopsies were taken with a cold forceps for                            histology in the entire colon. Moderate Sedation:      Per Anesthesia Care Recommendation:           - Patient has a contact number available for                            emergencies. The signs and symptoms of potential                            delayed complications were discussed with the                            patient. Return to normal activities tomorrow.                            Written  discharge instructions were provided to the                            patient.                           - Resume previous diet.                           - Continue present medications.                           - Await pathology results.                           - Repeat colonoscopy for surveillance based on                            pathology results.                           - Return to GI clinic in 8 weeks. Procedure Code(s):        --- Professional ---                           [phone], Colonoscopy, flexible; with biopsy, single                            or multiple Diagnosis Code(s):        --- Professional ---  K52.9, Noninfective gastroenteritis and colitis,                            unspecified                           K64.8, Other hemorrhoids                           K62.5, Hemorrhage of anus and rectum CPT copyright 2022 American Medical Association. All rights reserved. The codes documented in this report are preliminary and upon coder review may  be revised to meet current compliance requirements. Carlin POUR. Cindie, DO Carlin POUR. Cindie, DO 02/17/2024 10:28:15 AM This report has been signed electronically. Number of Addenda: 0

## 2024-02-17 NOTE — Anesthesia Preprocedure Evaluation (Signed)
 Anesthesia Evaluation  Patient identified by MRN, date of birth, ID band Patient awake    Reviewed: Allergy & Precautions, H&P , NPO status , Patient's Chart, lab work & pertinent test results, reviewed documented beta blocker date and time   Airway Mallampati: II  TM Distance: >3 FB Neck ROM: full    Dental no notable dental hx.    Pulmonary neg pulmonary ROS   Pulmonary exam normal breath sounds clear to auscultation       Cardiovascular Exercise Tolerance: Good hypertension, negative cardio ROS  Rhythm:regular Rate:Normal     Neuro/Psych negative neurological ROS  negative psych ROS   GI/Hepatic negative GI ROS, Neg liver ROS,,,  Endo/Other  Hypothyroidism  Class 3 obesity  Renal/GU negative Renal ROS  negative genitourinary   Musculoskeletal   Abdominal   Peds  Hematology negative hematology ROS (+)   Anesthesia Other Findings Son interpreting  Reproductive/Obstetrics negative OB ROS                              Anesthesia Physical Anesthesia Plan  ASA: 3  Anesthesia Plan: General   Post-op Pain Management:    Induction:   PONV Risk Score and Plan: Propofol  infusion  Airway Management Planned:   Additional Equipment:   Intra-op Plan:   Post-operative Plan:   Informed Consent: I have reviewed the patients History and Physical, chart, labs and discussed the procedure including the risks, benefits and alternatives for the proposed anesthesia with the patient or authorized representative who has indicated his/her understanding and acceptance.     Dental Advisory Given  Plan Discussed with: CRNA  Anesthesia Plan Comments:         Anesthesia Quick Evaluation

## 2024-02-17 NOTE — H&P (Signed)
 Primary Care Physician:  Catharine Ethelene BIRCH., PA-C Primary Gastroenterologist:  Dr. Cindie  Pre-Procedure History & Physical: HPI:  Kimberly Hicks is a 58 y.o. female is here for a colonoscopy for rectal bleeding  Past Medical History:  Diagnosis Date   Hyperlipemia    Hypothyroidism    Seasonal allergies     Past Surgical History:  Procedure Laterality Date   CESAREAN SECTION     twice   ESOPHAGOGASTRODUODENOSCOPY N/A 11/25/2023   Procedure: EGD (ESOPHAGOGASTRODUODENOSCOPY);  Surgeon: Cindie Carlin POUR, DO;  Location: [facility];  Service: Endoscopy;  Laterality: N/A;  11:30 am, asa 2   TUMOR EXCISION      Prior to Admission medications   Medication Sig Start Date End Date Taking? Authorizing Provider  cetirizine (ZYRTEC) 10 MG tablet Take 1 tablet by mouth daily.   Yes [provider]  Cholecalciferol (VITAMIN D-3) 125 MCG (5000 UT) TABS Take 1 tablet by mouth daily.   Yes [provider]  famotidine  (PEPCID ) 20 MG tablet Take 20 mg by mouth at bedtime.   Yes [provider]  fenofibrate (TRICOR) 145 MG tablet Take 145 mg by mouth daily.   Yes [provider]  fluticasone (FLONASE) 50 MCG/ACT nasal spray Place into both nostrils daily.   Yes [provider]  levothyroxine (SYNTHROID) 88 MCG tablet Take 88 mcg by mouth daily before breakfast.   Yes [provider]  ondansetron  (ZOFRAN ) 4 MG tablet Take 4 mg by mouth every 8 (eight) hours as needed. 10/30/23  Yes [provider]  pantoprazole  (PROTONIX ) 40 MG tablet Take 1 tablet (40 mg total) by mouth 2 (two) times daily. 11/25/23 11/24/24 Yes Timmothy Baranowski K, DO  polyethylene glycol powder (GLYCOLAX /MIRALAX ) 17 GM/SCOOP powder Take one capful twice daily mixed in six ounces of liquid until soft stool. Then continue one capful once daily. 01/06/24   Ezzard Sonny RAMAN, PA-C    Allergies as of 01/20/2024 - Review Complete 01/06/2024  Allergen Reaction Noted   Polymyxin b    08/12/2013   Polytrim  [polymyxin b -trimethoprim ]  08/12/2013   Ciprofloxacin  Rash 09/06/2014    Family History  Problem Relation Age of Onset   Pulmonary embolism Mother    Alcohol abuse Sister    Colon cancer Neg Hx    Gastric cancer Neg Hx     Social History   Socioeconomic History   Marital status: Married    Spouse name: Not on file   Number of children: Not on file   Years of education: Not on file   Highest education level: Not on file  Occupational History   Not on file  Tobacco Use   Smoking status: Never   Smokeless tobacco: Never  Vaping Use   Vaping status: Never Used  Substance and Sexual Activity   Alcohol use: No   Drug use: No   Sexual activity: Not on file  Other Topics Concern   Not on file  Social History Narrative   Not on file   Social Drivers of Health   Financial Resource Strain: Not on file  Food Insecurity: Not on file  Transportation Needs: Not on file  Physical Activity: Not on file  Stress: Not on file  Social Connections: Not on file  Intimate Partner Violence: Not on file    Review of Systems: See HPI, otherwise negative ROS  Physical Exam: Vital signs in last 24 hours: Temp:  [98.4 F (36.9 C)] 98.4 F (36.9 C) (07/22 0824) Pulse Rate:  [  63] 63 (07/22 0824) BP: (126)/(67) 126/67 (07/22 0824) SpO2:  [100 %] 100 % (07/22 0824) Weight:  [95.7 kg] 95.7 kg (07/22 0824)   General:   Alert,  Well-developed, well-nourished, pleasant and cooperative in NAD Head:  Normocephalic and atraumatic. Eyes:  Sclera clear, no icterus.   Conjunctiva pink. Ears:  Normal auditory acuity. Nose:  No deformity, discharge,  or lesions. Msk:  Symmetrical without gross deformities. Normal posture. Extremities:  Without clubbing or edema. Neurologic:  Alert and  oriented x4;  grossly normal neurologically. Skin:  Intact without significant lesions or rashes. Psych:  Alert and cooperative. Normal mood and affect.  Impression/Plan: Kimberly Hicks is here for a colonoscopy to be performed for colon rectal bleeding  The risks of the procedure including infection, bleed, or perforation as well as benefits, limitations, alternatives and imponderables have been reviewed with the patient. Questions have been answered. All parties agreeable.

## 2024-02-17 NOTE — Discharge Instructions (Addendum)
 Su colonoscopia revel inflamacin en el lado izquierdo del colon. Esta inflamacin fue leve. Tom muestras de todo el colon. Le [employer]. No se encontraron plipos ni evidencia de cncer de colon. Tendr ignacia cita de seguimiento en la consulta de gastroenterologa en 6 a 8 semanas.  Dr. Cindie

## 2024-02-18 ENCOUNTER — Encounter (HOSPITAL_COMMUNITY): Payer: Self-pay | Admitting: Internal Medicine

## 2024-02-18 LAB — SURGICAL PATHOLOGY

## 2024-02-18 NOTE — Anesthesia Postprocedure Evaluation (Signed)
 Anesthesia Post Note  Patient: Kimberly Hicks  Procedure(s) Performed: COLONOSCOPY  Patient location during evaluation: Phase II Anesthesia Type: General Level of consciousness: awake Pain management: pain level controlled Vital Signs Assessment: post-procedure vital signs reviewed and stable Respiratory status: spontaneous breathing and respiratory function stable Cardiovascular status: blood pressure returned to baseline and stable Postop Assessment: no headache and no apparent nausea or vomiting Anesthetic complications: no Comments: Late entry   No notable events documented.   Last Vitals:  Vitals:   02/17/24 0824 02/17/24 1032  BP: 126/67 119/66  Pulse: 63 (!) 58  Resp:  (!) 24  Temp: 36.9 C 36.5 C  SpO2: 100% 100%    Last Pain:  Vitals:   02/17/24 1032  TempSrc: Oral  PainSc: 5                  Yvonna JINNY Bosworth

## 2024-02-26 ENCOUNTER — Ambulatory Visit: Payer: Self-pay | Admitting: Internal Medicine

## 2024-06-17 ENCOUNTER — Encounter: Payer: Self-pay | Admitting: Internal Medicine

## 2024-06-22 ENCOUNTER — Encounter: Payer: Self-pay | Admitting: Internal Medicine
